# Patient Record
Sex: Female | Born: 1950 | Race: White | Hispanic: No | Marital: Married | Smoking: Never smoker
Health system: Southern US, Community
[De-identification: ages and names within clinical notes are randomized; demographics above are authoritative.]

## PROBLEM LIST (undated history)

## (undated) DIAGNOSIS — I4589 Other specified conduction disorders: Secondary | ICD-10-CM

## (undated) DIAGNOSIS — T148XXA Other injury of unspecified body region, initial encounter: Secondary | ICD-10-CM

## (undated) DIAGNOSIS — Z860101 Personal history of adenomatous and serrated colon polyps: Secondary | ICD-10-CM

## (undated) DIAGNOSIS — F419 Anxiety disorder, unspecified: Secondary | ICD-10-CM

## (undated) DIAGNOSIS — K219 Gastro-esophageal reflux disease without esophagitis: Secondary | ICD-10-CM

## (undated) DIAGNOSIS — D126 Benign neoplasm of colon, unspecified: Secondary | ICD-10-CM

## (undated) DIAGNOSIS — F32A Depression, unspecified: Secondary | ICD-10-CM

## (undated) DIAGNOSIS — K2100 Gastro-esophageal reflux disease with esophagitis, without bleeding: Secondary | ICD-10-CM

## (undated) DIAGNOSIS — E785 Hyperlipidemia, unspecified: Secondary | ICD-10-CM

## (undated) DIAGNOSIS — F329 Major depressive disorder, single episode, unspecified: Secondary | ICD-10-CM

## (undated) DIAGNOSIS — M1990 Unspecified osteoarthritis, unspecified site: Secondary | ICD-10-CM

## (undated) DIAGNOSIS — E782 Mixed hyperlipidemia: Secondary | ICD-10-CM

## (undated) DIAGNOSIS — G4730 Sleep apnea, unspecified: Secondary | ICD-10-CM

## (undated) DIAGNOSIS — I499 Cardiac arrhythmia, unspecified: Secondary | ICD-10-CM

## (undated) HISTORY — PX: TONSILLECTOMY: SHX5217

## (undated) HISTORY — DX: Other injury of unspecified body region, initial encounter: T14.8XXA

## (undated) HISTORY — PX: SHOULDER ARTHROSCOPY W/ ROTATOR CUFF REPAIR: SHX2400

## (undated) HISTORY — PX: ORIF RADIAL FRACTURE: SHX5113

## (undated) HISTORY — PX: ADENOIDECTOMY: SUR15

## (undated) HISTORY — PX: TONSILLECTOMY: SUR1361

## (undated) HISTORY — PX: BASAL CELL CARCINOMA EXCISION: SHX1214

## (undated) HISTORY — PX: COLONOSCOPY: SHX174

---

## 1987-11-02 HISTORY — PX: ABDOMINAL HYSTERECTOMY: SHX81

## 1988-11-01 HISTORY — PX: NASAL SEPTUM SURGERY: SHX37

## 2010-08-24 ENCOUNTER — Ambulatory Visit: Payer: Self-pay | Admitting: Internal Medicine

## 2011-11-04 ENCOUNTER — Ambulatory Visit: Payer: Self-pay | Admitting: Internal Medicine

## 2012-11-08 ENCOUNTER — Ambulatory Visit: Payer: Self-pay | Admitting: Internal Medicine

## 2013-01-11 ENCOUNTER — Ambulatory Visit: Payer: Self-pay | Admitting: Internal Medicine

## 2013-02-27 ENCOUNTER — Ambulatory Visit: Payer: Self-pay | Admitting: Cardiology

## 2013-03-09 ENCOUNTER — Ambulatory Visit: Payer: Self-pay | Admitting: Internal Medicine

## 2013-03-09 ENCOUNTER — Emergency Department: Payer: Self-pay | Admitting: Emergency Medicine

## 2013-11-01 HISTORY — PX: JOINT REPLACEMENT: SHX530

## 2014-11-12 ENCOUNTER — Ambulatory Visit: Payer: Self-pay | Admitting: Internal Medicine

## 2017-01-21 ENCOUNTER — Other Ambulatory Visit: Payer: Self-pay | Admitting: Internal Medicine

## 2017-01-21 DIAGNOSIS — Z1231 Encounter for screening mammogram for malignant neoplasm of breast: Secondary | ICD-10-CM

## 2017-02-16 ENCOUNTER — Ambulatory Visit: Payer: Self-pay

## 2017-02-23 ENCOUNTER — Ambulatory Visit
Admission: RE | Admit: 2017-02-23 | Discharge: 2017-02-23 | Disposition: A | Discharge status: Home / Self Care | Payer: Medicare HMO | Source: Ambulatory Visit | Attending: Internal Medicine | Admitting: Internal Medicine

## 2017-02-23 DIAGNOSIS — Z1231 Encounter for screening mammogram for malignant neoplasm of breast: Secondary | ICD-10-CM | POA: Diagnosis not present

## 2017-02-23 DIAGNOSIS — R928 Other abnormal and inconclusive findings on diagnostic imaging of breast: Secondary | ICD-10-CM | POA: Diagnosis not present

## 2017-02-28 ENCOUNTER — Other Ambulatory Visit: Payer: Self-pay | Admitting: Internal Medicine

## 2017-02-28 DIAGNOSIS — R928 Other abnormal and inconclusive findings on diagnostic imaging of breast: Secondary | ICD-10-CM

## 2017-03-03 ENCOUNTER — Ambulatory Visit
Admission: RE | Admit: 2017-03-03 | Discharge: 2017-03-03 | Disposition: A | Discharge status: Home / Self Care | Payer: Medicare HMO | Source: Ambulatory Visit | Attending: Internal Medicine | Admitting: Internal Medicine

## 2017-03-03 DIAGNOSIS — N6489 Other specified disorders of breast: Secondary | ICD-10-CM | POA: Insufficient documentation

## 2017-03-03 DIAGNOSIS — R928 Other abnormal and inconclusive findings on diagnostic imaging of breast: Secondary | ICD-10-CM

## 2017-03-04 ENCOUNTER — Other Ambulatory Visit: Payer: Self-pay | Admitting: Internal Medicine

## 2017-03-04 DIAGNOSIS — R928 Other abnormal and inconclusive findings on diagnostic imaging of breast: Secondary | ICD-10-CM

## 2017-03-10 ENCOUNTER — Ambulatory Visit
Admission: RE | Admit: 2017-03-10 | Discharge: 2017-03-10 | Disposition: A | Discharge status: Home / Self Care | Payer: Medicare HMO | Source: Ambulatory Visit | Attending: Internal Medicine | Admitting: Internal Medicine

## 2017-03-10 DIAGNOSIS — R928 Other abnormal and inconclusive findings on diagnostic imaging of breast: Secondary | ICD-10-CM

## 2017-03-10 DIAGNOSIS — N6313 Unspecified lump in the right breast, lower outer quadrant: Secondary | ICD-10-CM | POA: Diagnosis not present

## 2017-03-10 HISTORY — PX: BREAST BIOPSY: SHX20

## 2017-03-14 LAB — SURGICAL PATHOLOGY

## 2017-03-16 ENCOUNTER — Encounter: Payer: Self-pay | Admitting: *Deleted

## 2017-03-22 ENCOUNTER — Encounter: Payer: Self-pay | Admitting: *Deleted

## 2017-03-23 ENCOUNTER — Ambulatory Visit (INDEPENDENT_AMBULATORY_CARE_PROVIDER_SITE_OTHER): Payer: Medicare HMO | Admitting: General Surgery

## 2017-03-23 ENCOUNTER — Encounter: Payer: Self-pay | Admitting: General Surgery

## 2017-03-23 VITALS — BP 118/80 | HR 88 | Resp 14 | Ht 63.0 in | Wt 201.0 lb

## 2017-03-23 DIAGNOSIS — R928 Other abnormal and inconclusive findings on diagnostic imaging of breast: Secondary | ICD-10-CM

## 2017-03-23 NOTE — Progress Notes (Signed)
Patient ID: Jillian Armstrong, female   DOB: October 22, 1951, 66 y.o.   MRN: [id number]  Chief Complaint  Patient presents with  . Other    HPI Jillian Armstrong is a 66 y.o. female.  who presents for a breast evaluation. The most recent mammogram and biopsy was done on 03-10-17, showing benign tissue. She feels like she wants this area removed. Patient does not perform regular self breast checks and gets regular mammograms done every other year.    She is here with her husband, [name] of 18 years. 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  HPI  Past Medical History:  Diagnosis Date  . Fracture    right arm    Past Surgical History:  Procedure Laterality Date  . ABDOMINAL HYSTERECTOMY  1989  . BREAST BIOPSY Right 03/10/2017   PREDOMINANTLY MATURE ADIPOSE TISSUE WITH RARE BENIGN MAMMARY   . 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 NASAL SEPTUM SURGERY  1990  . TONSILLECTOMY      Family History  Problem Relation Age of Onset  . Hypertension Mother   . Diabetes Mother   . Arthritis Mother   . COPD Mother   . Arthritis Father   . Breast cancer Neg Hx   . Colon cancer Neg Hx     Social History Social History  Substance Use Topics  . Smoking status: Never Smoker  . Smokeless tobacco: Never Used  . Alcohol use No    Allergies  Allergen Reactions  . Contrast Media [Iodinated Diagnostic Agents] Shortness Of Breath  . Morphine Hives  . Codeine Rash    Hyper   . Penicillins Rash    Current Outpatient Prescriptions  Medication Sig Dispense Refill  . acetaminophen (TYLENOL) 325 MG tablet Take by mouth.    Marland Kitchen b complex vitamins tablet Take 1 tablet by mouth daily.    Marland Kitchen estradiol (ESTRACE) 1 MG tablet     . omeprazole (PRILOSEC) 20 MG capsule Take 20 mg by mouth as needed.     . senna-docusate (SENOKOT-S) 8.6-50 MG tablet Take by mouth.    . SUCRALFATE PO Take by mouth as needed.     No current facility-administered medications for this visit.     Review of  Systems Review of Systems  Constitutional: Negative.   Respiratory: Negative.   Cardiovascular: Negative.     Blood pressure 118/80, pulse 88, resp. rate 14, height 5\' 3"  (1.6 m), weight 201 lb (91.2 kg).  Physical Exam Physical Exam  Constitutional: She is oriented to person, place, and time. She appears well-developed and well-nourished.  HENT:  Mouth/Throat: Oropharynx is clear and moist.  Eyes: Conjunctivae are normal. No scleral icterus.  Neck: Neck supple.  Cardiovascular: Normal rate, regular rhythm and normal heart sounds.   Pulmonary/Chest: Effort normal and breath sounds normal. Right breast exhibits no inverted nipple, no mass, no nipple discharge, no skin change and no tenderness. Left breast exhibits no inverted nipple, no mass, no nipple discharge, no skin change and no tenderness.  Right > left breast.   Lymphadenopathy:    She has no cervical adenopathy.    She has no axillary adenopathy.  Neurological: She is alert and oriented to person, place, and time.  Skin: Skin is warm and dry.  Psychiatric: Her behavior is normal.    Data Reviewed 03/10/2017 vacuum biopsy of the right breast reviewed: DIAGNOSIS:  A. BREAST, RIGHT 8:30; ULTRASOUND GUIDED BIOPSY:  - PREDOMINANTLY MATURE ADIPOSE TISSUE  WITH RARE BENIGN MAMMARY  EPITHELIUM.  - NEGATIVE FOR ATYPIA AND MALIGNANCY.  - DEEPER SECTIONS WERE EXAMINED.   Screening mammogram dated 02/24/2017 suggested possible distortion in the right breast. BI-RADS-0.  Diagnostic mammogram and ultrasound dated 03/03/2017 suggested a 0.5 x 0.5 x 1.0 cm area of distinct shadowing in the 8:30 o'clock position of the right breast thought to possibly correlate with the area of distortion. Biopsy recommended. Results above.   Postbiopsy imaging reported the biopsy clip in the area of ultrasound abnormality. No mention of whether this area correlated with the distortion which prompted the initial recommendation for additional imaging.  Report from the radiologist that this was a discordant finding. Surgical excision was recommended.  11/12/2014 mammograms were reviewed and compared to the recently completed his studies. No discernible interval change on 2-D imaging.  Assessment    Mammographic distortion/ultrasound abnormality, unremarkable biopsy.    Plan    Options for management were reviewed: 1) Formal excision,? Mammographic distortion versus area recently biopsied with ultrasound versus 2) observation.  Pros and cons of each approach were reviewed.  Initial recommendation was for a 1 year follow-up if she did not proceed to formal excision. Based on review of the radiology reports post visit, it would be most reasonable to do a 6 month follow-up if surgical excision is not pursued.       Discussed excision of right breast mass vs observation. She can call the office in April 2019 for mammogram review. The patient is aware to call back for any questions or concerns. Follow up as needed.   HPI, Physical Exam, Assessment and Plan have been scribed under the direction and in the presence of Robert Bellow, MD.  Karie Fetch, RN  I have completed the exam and reviewed the above documentation for accuracy and completeness.  I agree with the above.  Haematologist has been used and any errors in dictation or transcription are unintentional.  Hervey Ard, M.D., F.A.C.S. Robert Bellow 03/23/2017, 8:47 PM

## 2017-03-23 NOTE — Patient Instructions (Addendum)
The patient is aware to call back for any questions or concerns. She can call the office in April 2019 for mammogram review.

## 2017-03-24 ENCOUNTER — Telehealth: Payer: Self-pay | Admitting: *Deleted

## 2017-03-24 NOTE — Telephone Encounter (Signed)
-----   Message from Robert Bellow, MD sent at 03/23/2017  8:53 PM EDT ----- Please notify the patient that I have reviewed the mammograms and reports dating back to 2016. Based on the radiologist concern postbiopsy I would recommend at a minimum a six-month follow-up right diagnostic mammogram to be scheduled through this office rather that waiting 12 months.

## 2017-03-24 NOTE — Telephone Encounter (Signed)
Notified patient as instructed, patient pleased. Discussed follow-up appointments, patient agrees  

## 2017-03-25 ENCOUNTER — Other Ambulatory Visit: Payer: Self-pay | Admitting: General Surgery

## 2017-03-25 ENCOUNTER — Other Ambulatory Visit: Payer: Self-pay

## 2017-03-25 ENCOUNTER — Telehealth: Payer: Self-pay | Admitting: General Surgery

## 2017-03-25 DIAGNOSIS — R928 Other abnormal and inconclusive findings on diagnostic imaging of breast: Secondary | ICD-10-CM

## 2017-03-25 NOTE — Telephone Encounter (Signed)
After the patient's visit I had a chance to review all of the mammogram and ultrasound reports, and had an unclear understanding as to whether the mammographic and ultrasound areas of concern were one in the same.  The films were re-reviewed with Enrique Sack, M.D., head of mammography and its thought likely that they are indeed one in the same. The diagnostic study showing distortion followed by ultrasound showing a hypoechoic area followed by post biopsy, 2-D images suggest but doesn't absolutely confirm they're all one in the same, although they are in close proximity at the worst.  Options for management include a 3-D stereo biopsy of the original distortion and post biopsy imaging to confirm relationship to the ultrasound finding versus wire localization with excision of a broader swath of breast parenchyma to include the ultrasound biopsy site with anticipation that this will include the area of architectural distortion noted on 3-D imaging.  This new analysis of the available data was conveyed to the patient and she is amenable to proceed with wire localization and surgical excision.

## 2017-03-29 ENCOUNTER — Telehealth: Payer: Self-pay

## 2017-03-29 NOTE — Telephone Encounter (Signed)
Call to patient to review surgery instructions and arrival time. The patient is scheduled for surgery at [facility] on 04/06/17. She will arrive at the [facility] on 04/06/17 at 8:15 am for her wire location. She will pre admit by phone. The patient is aware of date and time.

## 2017-03-29 NOTE — Telephone Encounter (Signed)
thanks

## 2017-03-31 ENCOUNTER — Encounter
Admission: RE | Admit: 2017-03-31 | Discharge: 2017-03-31 | Disposition: A | Discharge status: Home / Self Care | Payer: Medicare HMO | Source: Ambulatory Visit | Attending: General Surgery | Admitting: General Surgery

## 2017-03-31 HISTORY — DX: Unspecified osteoarthritis, unspecified site: M19.90

## 2017-03-31 HISTORY — DX: Gastro-esophageal reflux disease without esophagitis: K21.9

## 2017-03-31 NOTE — Patient Instructions (Signed)
  Your procedure is scheduled on: 04-06-17 [facility] Report to [place] @ 8:15 AM  Remember: Instructions that are not followed completely may result in serious medical risk, up to and including death, or upon the discretion of your surgeon and anesthesiologist your surgery may need to be rescheduled.    _x___ 1. Do not eat food or drink liquids after midnight. No gum chewing or hard candies.     __x__ 2. No Alcohol for 24 hours before or after surgery.   __x__3. No Smoking for 24 prior to surgery.   ____  4. Bring all medications with you on the day of surgery if instructed.    __x__ 5. Notify your doctor if there is any change in your medical condition     (cold, fever, infections).     Do not wear jewelry, make-up, hairpins, clips or nail polish.  Do not wear lotions, powders, or perfumes. You may wear deodorant.  Do not shave 48 hours prior to surgery. Men may shave face and neck.  Do not bring valuables to the hospital.    [facility] is not responsible for any belongings or valuables.               Contacts, dentures or bridgework may not be worn into surgery.  Leave your suitcase in the car. After surgery it may be brought to your room.  For patients admitted to the hospital, discharge time is determined by your treatment team.   Patients discharged the day of surgery will not be allowed to drive home.  You will need someone to drive you home and stay with you the night of your procedure.    Please read over the following fact sheets that you were given:   _x___ Singer WITH A SMALL SIP OF WATER. These include:  1. PRILOSEC (OMEPRAZOLE)  2. TAKE A PRILOSEC THE NIGHT BEFORE SURGERY (04-05-17)  3.  4.  5.  6.  ____Fleets enema or Magnesium Citrate as directed.   _x___ Use CHG Soap or sage wipes as directed on instruction sheet   ____ Use inhalers on the day of surgery and bring to hospital day of surgery  ____  Stop Metformin and Janumet 2 days prior to surgery.    ____ Take 1/2 of usual insulin dose the night before surgery and none on the morning surgery.   _x___ Follow recommendations from Cardiologist, Pulmonologist or PCP regarding stopping Aspirin, Coumadin, Pllavix ,Eliquis, Effient, or Pradaxa, and Pletal-OK TO CONTINUE 81 MG ASPIRIN-DO NOT TAKE AM OF SURGERY  ____Stop Anti-inflammatories such as Advil, Aleve, Ibuprofen, Motrin, Naproxen, Naprosyn, Goodies powders or aspirin products. OK to take Tylenol   _x___ Stop supplements until after surgery-STOP FISH OIL NOW-MAY RESUME AFTER SURGERY   ____ Bring C-Pap to the hospital.

## 2017-04-04 ENCOUNTER — Encounter
Admission: RE | Admit: 2017-04-04 | Discharge: 2017-04-04 | Disposition: A | Discharge status: Home / Self Care | Payer: Medicare HMO | Source: Ambulatory Visit | Attending: General Surgery | Admitting: General Surgery

## 2017-04-04 DIAGNOSIS — Z880 Allergy status to penicillin: Secondary | ICD-10-CM | POA: Diagnosis not present

## 2017-04-04 DIAGNOSIS — Z79899 Other long term (current) drug therapy: Secondary | ICD-10-CM | POA: Diagnosis not present

## 2017-04-04 DIAGNOSIS — K219 Gastro-esophageal reflux disease without esophagitis: Secondary | ICD-10-CM | POA: Diagnosis not present

## 2017-04-04 DIAGNOSIS — N6031 Fibrosclerosis of right breast: Secondary | ICD-10-CM | POA: Diagnosis not present

## 2017-04-04 DIAGNOSIS — Z7982 Long term (current) use of aspirin: Secondary | ICD-10-CM | POA: Diagnosis not present

## 2017-04-04 DIAGNOSIS — Z91041 Radiographic dye allergy status: Secondary | ICD-10-CM | POA: Diagnosis not present

## 2017-04-04 DIAGNOSIS — N6091 Unspecified benign mammary dysplasia of right breast: Secondary | ICD-10-CM | POA: Diagnosis not present

## 2017-04-04 DIAGNOSIS — R928 Other abnormal and inconclusive findings on diagnostic imaging of breast: Secondary | ICD-10-CM | POA: Diagnosis present

## 2017-04-04 DIAGNOSIS — Z885 Allergy status to narcotic agent status: Secondary | ICD-10-CM | POA: Diagnosis not present

## 2017-04-04 NOTE — Pre-Procedure Instructions (Addendum)
Spoke with Dr Randa Lynn regarding abnormal EKG- Dr Randa Lynn looked up EKG in Epic-Informed her that pt has no cardiac history and asymptomatic-EKG was done only due to Anesthesia's age requirement of > 67 years old.  Dr Randa Lynn states ok to proceed with surgery

## 2017-04-06 ENCOUNTER — Ambulatory Visit
Admission: RE | Admit: 2017-04-06 | Discharge: 2017-04-06 | Disposition: A | Discharge status: Home / Self Care | Payer: Medicare HMO | Source: Ambulatory Visit | Attending: General Surgery | Admitting: General Surgery

## 2017-04-06 ENCOUNTER — Encounter
Admission: RE | Disposition: A | Discharge status: Home / Self Care | Payer: Self-pay | Source: Ambulatory Visit | Attending: General Surgery

## 2017-04-06 ENCOUNTER — Ambulatory Visit: Payer: Medicare HMO | Admitting: Anesthesiology

## 2017-04-06 ENCOUNTER — Encounter: Payer: Self-pay | Admitting: *Deleted

## 2017-04-06 DIAGNOSIS — N6031 Fibrosclerosis of right breast: Secondary | ICD-10-CM | POA: Diagnosis not present

## 2017-04-06 DIAGNOSIS — R928 Other abnormal and inconclusive findings on diagnostic imaging of breast: Secondary | ICD-10-CM

## 2017-04-06 DIAGNOSIS — Z7982 Long term (current) use of aspirin: Secondary | ICD-10-CM | POA: Insufficient documentation

## 2017-04-06 DIAGNOSIS — Z885 Allergy status to narcotic agent status: Secondary | ICD-10-CM | POA: Insufficient documentation

## 2017-04-06 DIAGNOSIS — Z91041 Radiographic dye allergy status: Secondary | ICD-10-CM | POA: Insufficient documentation

## 2017-04-06 DIAGNOSIS — N6091 Unspecified benign mammary dysplasia of right breast: Secondary | ICD-10-CM | POA: Insufficient documentation

## 2017-04-06 DIAGNOSIS — K219 Gastro-esophageal reflux disease without esophagitis: Secondary | ICD-10-CM | POA: Insufficient documentation

## 2017-04-06 DIAGNOSIS — Z79899 Other long term (current) drug therapy: Secondary | ICD-10-CM | POA: Insufficient documentation

## 2017-04-06 DIAGNOSIS — Z880 Allergy status to penicillin: Secondary | ICD-10-CM | POA: Insufficient documentation

## 2017-04-06 HISTORY — PX: BREAST EXCISIONAL BIOPSY: SUR124

## 2017-04-06 HISTORY — PX: BREAST BIOPSY: SHX20

## 2017-04-06 SURGERY — BREAST BIOPSY WITH NEEDLE LOCALIZATION
Anesthesia: General | Laterality: Right | Wound class: Clean

## 2017-04-06 MED ORDER — DEXAMETHASONE SODIUM PHOSPHATE 10 MG/ML IJ SOLN
INTRAMUSCULAR | Status: DC | PRN
  Administered 2017-04-06: 4 mg via INTRAVENOUS

## 2017-04-06 MED ORDER — PROPOFOL 10 MG/ML IV BOLUS
INTRAVENOUS | Status: DC | PRN
Start: 2017-04-06 — End: 2017-04-06
  Administered 2017-04-06: 150 mg via INTRAVENOUS

## 2017-04-06 MED ORDER — LIDOCAINE HCL (CARDIAC) 20 MG/ML IV SOLN
INTRAVENOUS | Status: DC | PRN
  Administered 2017-04-06: 50 mg via INTRAVENOUS

## 2017-04-06 MED ORDER — EPHEDRINE SULFATE 50 MG/ML IJ SOLN
INTRAMUSCULAR | Status: AC
  Filled 2017-04-06: qty 1

## 2017-04-06 MED ORDER — ONDANSETRON HCL 4 MG/2ML IJ SOLN
INTRAMUSCULAR | Status: AC
  Filled 2017-04-06: qty 2

## 2017-04-06 MED ORDER — LIDOCAINE HCL (PF) 2 % IJ SOLN
INTRAMUSCULAR | Status: AC
  Filled 2017-04-06: qty 2

## 2017-04-06 MED ORDER — ACETAMINOPHEN 325 MG PO TABS
650.0000 mg | ORAL_TABLET | Freq: Four times a day (QID) | ORAL | Status: DC | PRN
  Administered 2017-04-06: 650 mg via ORAL

## 2017-04-06 MED ORDER — FENTANYL CITRATE (PF) 100 MCG/2ML IJ SOLN: 25.0000 ug | INTRAMUSCULAR | Status: DC | PRN

## 2017-04-06 MED ORDER — KETOROLAC TROMETHAMINE 30 MG/ML IJ SOLN
INTRAMUSCULAR | Status: DC | PRN
  Administered 2017-04-06: 15 mg via INTRAVENOUS

## 2017-04-06 MED ORDER — FENTANYL CITRATE (PF) 100 MCG/2ML IJ SOLN
INTRAMUSCULAR | Status: DC | PRN
  Administered 2017-04-06: 25 ug via INTRAVENOUS
  Administered 2017-04-06: 50 ug via INTRAVENOUS

## 2017-04-06 MED ORDER — PROPOFOL 10 MG/ML IV BOLUS
INTRAVENOUS | Status: AC
  Filled 2017-04-06: qty 20

## 2017-04-06 MED ORDER — LACTATED RINGERS IV SOLN
INTRAVENOUS | Status: DC
  Administered 2017-04-06: 11:00:00 via INTRAVENOUS

## 2017-04-06 MED ORDER — MIDAZOLAM HCL 2 MG/2ML IJ SOLN
INTRAMUSCULAR | Status: AC
  Filled 2017-04-06: qty 2

## 2017-04-06 MED ORDER — PHENYLEPHRINE HCL 10 MG/ML IJ SOLN
INTRAMUSCULAR | Status: DC | PRN
  Administered 2017-04-06 (×2): 50 ug via INTRAVENOUS

## 2017-04-06 MED ORDER — ONDANSETRON HCL 4 MG/2ML IJ SOLN
INTRAMUSCULAR | Status: DC | PRN
Start: 2017-04-06 — End: 2017-04-06
  Administered 2017-04-06: 4 mg via INTRAVENOUS

## 2017-04-06 MED ORDER — ONDANSETRON HCL 4 MG/2ML IJ SOLN: 4.0000 mg | Freq: Once | INTRAMUSCULAR | Status: DC | PRN

## 2017-04-06 MED ORDER — MIDAZOLAM HCL 2 MG/2ML IJ SOLN
INTRAMUSCULAR | Status: DC | PRN
  Administered 2017-04-06: 2 mg via INTRAVENOUS

## 2017-04-06 MED ORDER — BUPIVACAINE-EPINEPHRINE (PF) 0.5% -1:200000 IJ SOLN
INTRAMUSCULAR | Status: AC
  Filled 2017-04-06: qty 30

## 2017-04-06 MED ORDER — DEXAMETHASONE SODIUM PHOSPHATE 10 MG/ML IJ SOLN
INTRAMUSCULAR | Status: AC
  Filled 2017-04-06: qty 1

## 2017-04-06 MED ORDER — HYDROCODONE-ACETAMINOPHEN 5-325 MG PO TABS: 1.0000 | ORAL_TABLET | ORAL | 0 refills | Status: AC | PRN

## 2017-04-06 MED ORDER — SODIUM CHLORIDE 0.9 % IJ SOLN
INTRAMUSCULAR | Status: AC
  Filled 2017-04-06: qty 10

## 2017-04-06 MED ORDER — EPHEDRINE SULFATE 50 MG/ML IJ SOLN
INTRAMUSCULAR | Status: DC | PRN
Start: 2017-04-06 — End: 2017-04-06
  Administered 2017-04-06: 5 mg via INTRAVENOUS

## 2017-04-06 MED ORDER — FENTANYL CITRATE (PF) 100 MCG/2ML IJ SOLN
INTRAMUSCULAR | Status: AC
  Filled 2017-04-06: qty 2

## 2017-04-06 MED ORDER — ACETAMINOPHEN 325 MG PO TABS
ORAL_TABLET | ORAL | Status: AC
  Filled 2017-04-06: qty 2

## 2017-04-06 SURGICAL SUPPLY — 39 items
BANDAGE ELASTIC 6 LF NS (GAUZE/BANDAGES/DRESSINGS) ×3 IMPLANT
BLADE SURG 15 STRL SS SAFETY (BLADE) ×6 IMPLANT
BNDG GAUZE 4.5X4.1 6PLY STRL (MISCELLANEOUS) ×6 IMPLANT
CANISTER SUCT 1200ML W/VALVE (MISCELLANEOUS) ×3 IMPLANT
CHLORAPREP W/TINT 26ML (MISCELLANEOUS) ×6 IMPLANT
CLOSURE WOUND 1/2 X4 (GAUZE/BANDAGES/DRESSINGS) ×2
CNTNR SPEC 2.5X3XGRAD LEK (MISCELLANEOUS) ×1
CONT SPEC 4OZ STER OR WHT (MISCELLANEOUS) ×2
CONTAINER SPEC 2.5X3XGRAD LEK (MISCELLANEOUS) ×1 IMPLANT
COVER PROBE FLX POLY STRL (MISCELLANEOUS) ×3 IMPLANT
DEVICE DUBIN SPECIMEN MAMMOGRA (MISCELLANEOUS) ×3 IMPLANT
DRAPE CHEST BREAST 77X106 FENE (MISCELLANEOUS) ×3 IMPLANT
DRAPE LAPAROTOMY 100X77 ABD (DRAPES) ×3 IMPLANT
DRSG TELFA 4X3 1S NADH ST (GAUZE/BANDAGES/DRESSINGS) ×6 IMPLANT
ELECT CAUTERY BLADE TIP 2.5 (TIP) ×3
ELECT REM PT RETURN 9FT ADLT (ELECTROSURGICAL) ×3
ELECTRODE CAUTERY BLDE TIP 2.5 (TIP) ×1 IMPLANT
ELECTRODE REM PT RTRN 9FT ADLT (ELECTROSURGICAL) ×1 IMPLANT
GAUZE FLUFF 18X24 1PLY STRL (GAUZE/BANDAGES/DRESSINGS) ×3 IMPLANT
GLOVE BIO SURGEON STRL SZ7.5 (GLOVE) ×3 IMPLANT
GLOVE INDICATOR 8.0 STRL GRN (GLOVE) ×3 IMPLANT
GOWN STRL REUS W/ TWL LRG LVL3 (GOWN DISPOSABLE) ×2 IMPLANT
GOWN STRL REUS W/TWL LRG LVL3 (GOWN DISPOSABLE) ×4
KIT RM TURNOVER STRD PROC AR (KITS) ×3 IMPLANT
LABEL OR SOLS (LABEL) ×3 IMPLANT
MARGIN MAP 10MM (MISCELLANEOUS) ×3 IMPLANT
NDL SAFETY 22GX1.5 (NEEDLE) ×3 IMPLANT
NEEDLE HYPO 25X1 1.5 SAFETY (NEEDLE) ×3 IMPLANT
PACK BASIN MINOR ARMC (MISCELLANEOUS) ×3 IMPLANT
STRIP CLOSURE SKIN 1/2X4 (GAUZE/BANDAGES/DRESSINGS) ×4 IMPLANT
SUT ETHILON 3-0 FS-10 30 BLK (SUTURE) ×3
SUT VIC AB 2-0 CT1 27 (SUTURE) ×2
SUT VIC AB 2-0 CT1 TAPERPNT 27 (SUTURE) ×1 IMPLANT
SUT VIC AB 4-0 FS2 27 (SUTURE) ×3 IMPLANT
SUTURE EHLN 3-0 FS-10 30 BLK (SUTURE) ×1 IMPLANT
SWABSTK COMLB BENZOIN TINCTURE (MISCELLANEOUS) ×6 IMPLANT
SYR CONTROL 10ML (SYRINGE) ×3 IMPLANT
TAPE TRANSPORE STRL 2 31045 (GAUZE/BANDAGES/DRESSINGS) ×3 IMPLANT
WATER STERILE IRR 1000ML POUR (IV SOLUTION) ×3 IMPLANT

## 2017-04-06 NOTE — OR Nursing (Signed)
Dr. Bary Castilla in to see pt [id number].

## 2017-04-06 NOTE — Transfer of Care (Signed)
Immediate Anesthesia Transfer of Care Note  Patient: Jillian Armstrong  Procedure(s) Performed: Procedure(s): BREAST BIOPSY WITH NEEDLE LOCALIZATION (Right)  Patient Location: PACU  Anesthesia Type:General  Level of Consciousness: sedated and responds to stimulation  Airway & Oxygen Therapy: Patient Spontanous Breathing and Patient connected to face mask oxygen  Post-op Assessment: Report given to RN and Post -op Vital signs reviewed and stable  Post vital signs: Reviewed and stable  Last Vitals:  Vitals:   04/06/17 0931 04/06/17 1316  BP: 122/73 120/63  Pulse: 73 93  Resp: 18 14  Temp: 36.7 C     Last Pain:  Vitals:   04/06/17 0931  TempSrc: Oral  PainSc: 3          Complications: No apparent anesthesia complications

## 2017-04-06 NOTE — Anesthesia Preprocedure Evaluation (Signed)
Anesthesia Evaluation  Patient identified by MRN, date of birth, ID band Patient awake    Reviewed: Allergy & Precautions, NPO status , Patient's Chart, lab work & pertinent test results  Airway Mallampati: II       Dental  (+) Teeth Intact   Pulmonary neg pulmonary ROS,    breath sounds clear to auscultation       Cardiovascular Exercise Tolerance: Good  Rhythm:Regular     Neuro/Psych negative neurological ROS  negative psych ROS   GI/Hepatic Neg liver ROS, GERD  Medicated,  Endo/Other  negative endocrine ROS  Renal/GU negative Renal ROS     Musculoskeletal   Abdominal   Peds negative pediatric ROS (+)  Hematology   Anesthesia Other Findings   Reproductive/Obstetrics                             Anesthesia Physical Anesthesia Plan  ASA: II  Anesthesia Plan: General   Post-op Pain Management:    Induction: Intravenous  PONV Risk Score and Plan:   Airway Management Planned: LMA  Additional Equipment:   Intra-op Plan:   Post-operative Plan: Extubation in OR  Informed Consent: I have reviewed the patients History and Physical, chart, labs and discussed the procedure including the risks, benefits and alternatives for the proposed anesthesia with the patient or authorized representative who has indicated his/her understanding and acceptance.     Plan Discussed with: CRNA  Anesthesia Plan Comments:         Anesthesia Quick Evaluation

## 2017-04-06 NOTE — Anesthesia Procedure Notes (Signed)
Procedure Name: LMA Insertion Performed by: Louvinia Cumbo Pre-anesthesia Checklist: Patient identified, Patient being monitored, Timeout performed, Emergency Drugs available and Suction available Patient Re-evaluated:Patient Re-evaluated prior to inductionOxygen Delivery Method: Circle system utilized Preoxygenation: Pre-oxygenation with 100% oxygen Intubation Type: IV induction LMA: LMA inserted LMA Size: 3.5 Tube type: Oral Number of attempts: 1 Placement Confirmation: positive ETCO2 and breath sounds checked- equal and bilateral Tube secured with: Tape Dental Injury: Teeth and Oropharynx as per pre-operative assessment        

## 2017-04-06 NOTE — Anesthesia Postprocedure Evaluation (Signed)
Anesthesia Post Note  Patient: Jillian Armstrong  Procedure(s) Performed: Procedure(s) (LRB): BREAST BIOPSY WITH NEEDLE LOCALIZATION (Right)  Patient location during evaluation: PACU Anesthesia Type: General Level of consciousness: awake Pain management: pain level controlled Vital Signs Assessment: post-procedure vital signs reviewed and stable Respiratory status: spontaneous breathing Cardiovascular status: stable Anesthetic complications: no     Last Vitals:  Vitals:   04/06/17 0931 04/06/17 1316  BP: 122/73 120/63  Pulse: 73 93  Resp: 18 14  Temp: 36.7 C     Last Pain:  Vitals:   04/06/17 0931  TempSrc: Oral  PainSc: 3                  VAN STAVEREN,Mahiya Kercheval

## 2017-04-06 NOTE — Discharge Instructions (Signed)

## 2017-04-06 NOTE — H&P (Signed)
Tolerated needle localization well.  Films reviewed. Plans for excision of original biopsy site discussed.  No change in health since office exam.

## 2017-04-06 NOTE — Addendum Note (Signed)
Addendum  created 04/06/17 1502 by Lance Muss, CRNA   Anesthesia Event edited, Anesthesia Intra Meds edited

## 2017-04-06 NOTE — Op Note (Signed)
Preoperative diagnosis: Abnormal right breast mammogram, inconclusive stereotactic biopsy.  Postoperative diagnosis: Same.  Operative procedure: Right breast biopsy with needle localization.  Operating surgeon: Lanney Gins, M.D.  Anesthesia: Gen. by LMA, Marcaine 0.5% with 1-200,000 of epinephrine: 20 mL.  Estimated blood loss: Less than 5 mL.  Clinical note: This 66 year old woman recently underwent a biopsy for distortion in the right breast. Biopsy was felt to scored by the radiology service. She was offered open biopsy for confirmation of an anticipated benign pathologic process.  The patient underwent wire localization prior the procedure with the wire passing immediately adjacent to the previously placed clip.  Operative note: With the patient under adequate general the breast was prepped with ChloraPrep and draped. Marcaine was infiltrated for postoperative analgesia. The radial incision was made from the localizing wire proximally towards nipple approximate 3 cm in length. The skin was incised sharply and the specimen dissection was completed with sharp dissection. The wire with the intact  tip was excised, the specimen orientated and specimen radiograph confirmed the previously placed clip and the intact wire. The wound was approximated with interrupted 2-0 Vicryl sutures in multiple layers. The skin was closed with a running 4-0 Vicryl septic suture. Benzoin, Steri-Strips, followed by a honeycomb dressing was applied.  The patient tolerated the procedure well and was taken to recovery in stable condition.

## 2017-04-06 NOTE — Anesthesia Post-op Follow-up Note (Cosign Needed)
Anesthesia QCDR form completed.        

## 2017-04-07 ENCOUNTER — Encounter: Payer: Self-pay | Admitting: General Surgery

## 2017-04-07 LAB — SURGICAL PATHOLOGY

## 2017-04-13 ENCOUNTER — Encounter: Payer: Self-pay | Admitting: General Surgery

## 2017-04-13 ENCOUNTER — Ambulatory Visit (INDEPENDENT_AMBULATORY_CARE_PROVIDER_SITE_OTHER): Payer: Medicare HMO | Admitting: General Surgery

## 2017-04-13 VITALS — BP 110/76 | HR 116 | Resp 12 | Ht 64.0 in | Wt 200.0 lb

## 2017-04-13 DIAGNOSIS — R928 Other abnormal and inconclusive findings on diagnostic imaging of breast: Secondary | ICD-10-CM

## 2017-04-13 NOTE — Progress Notes (Signed)
Patient ID: Jillian Armstrong, female   DOB: 1951/02/07, 66 y.o.   MRN: [id number]  Chief Complaint  Patient presents with  . Follow-up    HPI Jillian Armstrong is a 66 y.o. female here today for her follow up right breast biopsy done on 04/06/2017. Patient states she is doing well. Husband, Elta Guadeloupe is present at visit.   HPI  Past Medical History:  Diagnosis Date  . Arthritis    hands bil  . Fracture    right arm  . GERD (gastroesophageal reflux disease)     Past Surgical History:  Procedure Laterality Date  . ABDOMINAL HYSTERECTOMY  1989  . BREAST BIOPSY Right 03/10/2017   PREDOMINANTLY MATURE ADIPOSE TISSUE WITH RARE BENIGN MAMMARY   . BREAST BIOPSY Right 04/06/2017   Procedure: BREAST BIOPSY WITH NEEDLE LOCALIZATION;  Surgeon: Robert Bellow, MD;  Location: [facility] ORS;  Service: General;  Laterality: Right;  . BREAST EXCISIONAL BIOPSY Right 04/06/2017   Pt elected to have area excised. PREDOMINANTLY MATURE ADIPOSE TISSUE WITH RARE BENIGN MAMMARY   . 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 NASAL SEPTUM SURGERY  1990  . TONSILLECTOMY      Family History  Problem Relation Age of Onset  . Hypertension Mother   . Diabetes Mother   . Arthritis Mother   . COPD Mother   . Arthritis Father   . Breast cancer Neg Hx   . Colon cancer Neg Hx     Social History Social History  Substance Use Topics  . Smoking status: Never Smoker  . Smokeless tobacco: Never Used  . Alcohol use Yes     Comment: wine occ    Allergies  Allergen Reactions  . Contrast Media [Iodinated Diagnostic Agents] Shortness Of Breath  . Morphine Hives  . Codeine Rash    Hyper   . Penicillins Rash    Current Outpatient Prescriptions  Medication Sig Dispense Refill  . acetaminophen (TYLENOL) 325 MG tablet Take 325 mg by mouth at bedtime.     Marland Kitchen aspirin EC 81 MG tablet Take 81 mg by mouth daily.    Marland Kitchen b complex vitamins tablet Take 1 tablet by mouth as needed (PT DOES NOT TAKE CONSISTENTLY).     .  Calcium Carbonate (CALCIUM 600 PO) Take 1 tablet by mouth as needed (PT DOES NOT TAKE CONSISTENTLY).    . Cholecalciferol (VITAMIN D PO) Take 1 tablet by mouth as needed (DOES NOT TAKE CONSISTENTLY).    Marland Kitchen estradiol (ESTRACE) 1 MG tablet     . HYDROcodone-acetaminophen (NORCO) 5-325 MG tablet Take 1-2 tablets by mouth every 4 (four) hours as needed for moderate pain. 30 tablet 0  . Omega-3 Fatty Acids (FISH OIL PO) Take 1 tablet by mouth as needed (PT DOES NOT TAKE CONSISTENTLY).     Marland Kitchen omeprazole (PRILOSEC) 20 MG capsule Take 20 mg by mouth as needed.     . senna-docusate (SENOKOT-S) 8.6-50 MG tablet Take 2-3 tablets by mouth at bedtime.     . Simethicone (GAS-X PO) Take 1 tablet by mouth as needed.    . SUCRALFATE PO Take 1 tablet by mouth as needed.      No current facility-administered medications for this visit.     Review of Systems Review of Systems  Constitutional: Negative.   Respiratory: Negative.   Cardiovascular: Negative.     Blood pressure 110/76, pulse (!) 116, resp. rate 12, height 5\' 4"  (1.626 m), weight 200 lb (90.7 kg).  Physical Exam Physical Exam  Constitutional: She is oriented to person, place, and time. She appears well-developed and well-nourished.  Pulmonary/Chest: Right breast exhibits no inverted nipple, no mass, no nipple discharge, no skin change and no tenderness.  Right breast biopsy site is clean and healing well.   Neurological: She is alert and oriented to person, place, and time.  Skin: Skin is warm and dry.    Data Reviewed 04/06/2017 wire localization breast biopsy of the site previously biopsied stereotactically for distortion on 03/10/2017: A. BREAST, RIGHT; MAMMOGRAPHICALLY-DIRECTED EXCISION WITH WIRE  LOCALIZATION:  - AREAS OF NONSPECIFIC FIBROSIS, FOCAL USUAL DUCTAL HYPERPLASIA, AND A  FEW APOCRINE MICROCYSTS.  - BIOPSY SITE CHANGES AND MARKER CLIP PRESENT.  - NEGATIVE FOR ATYPIA AND MALIGNANCY.   Assessment    Doing well status post  open biopsy for mammographic distortion with discordant stereo biopsy.    Plan    The patient will resume annual screening mammograms with Emily Filbert, M.D. in May 2018.    Patient to return as needed. The patient is aware to call back for any questions or concerns.  HPI, Physical Exam, Assessment and Plan have been scribed under the direction and in the presence of Hervey Ard, MD.  Gaspar Cola, CMA  I have completed the exam and reviewed the above documentation for accuracy and completeness.  I agree with the above.  Haematologist has been used and any errors in dictation or transcription are unintentional.  Hervey Ard, M.D., F.A.C.S.  Robert Bellow 04/14/2017, 8:40 PM

## 2017-04-13 NOTE — Patient Instructions (Signed)
Patient to return as needed. The patient is aware to call back for any questions or concerns. 

## 2018-11-14 IMAGING — MG MM DIGITAL DIAGNOSTIC UNILAT*R* W/ TOMO W/ CAD
6 of 9 series · 6 of 21 positions shown · non-contrast
Comparison: Previous exam(s).

CLINICAL DATA: Screening recall for a possible right breast
distortion.

EXAM:
2D DIGITAL DIAGNOSTIC UNILATERAL RIGHT MAMMOGRAM WITH CAD AND
ADJUNCT TOMO
RIGHT BREAST ULTRASOUND

[R CC synth-2D (1 of 2)]
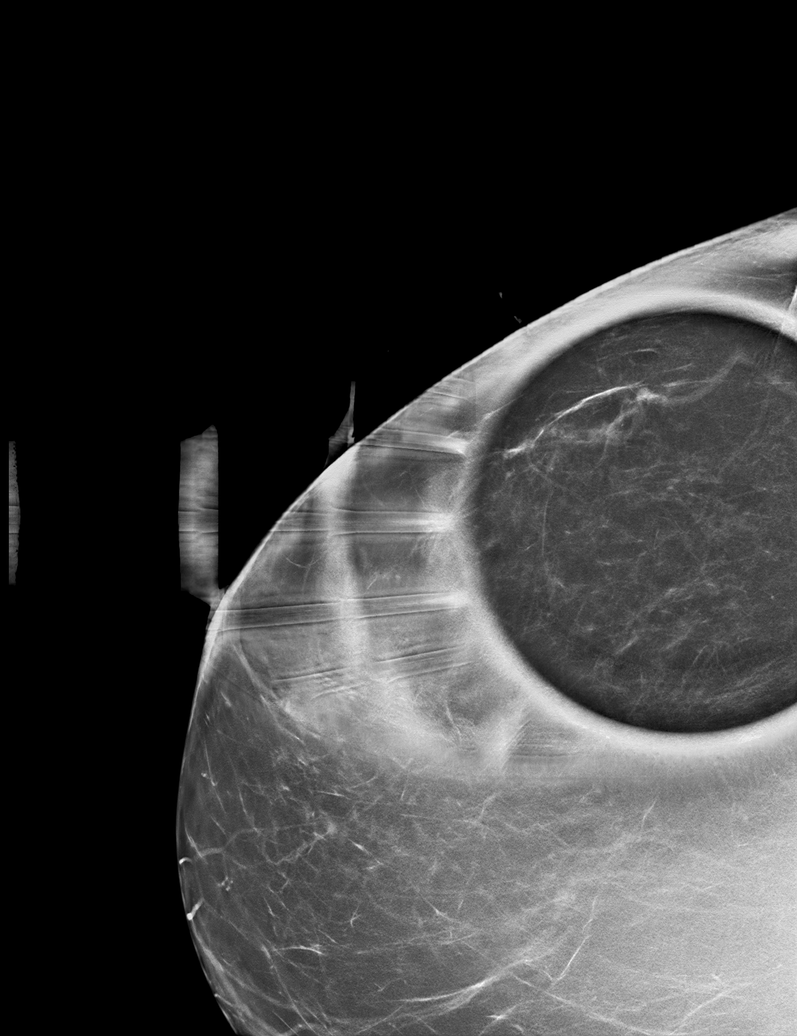

[R CC (1 of 2)]
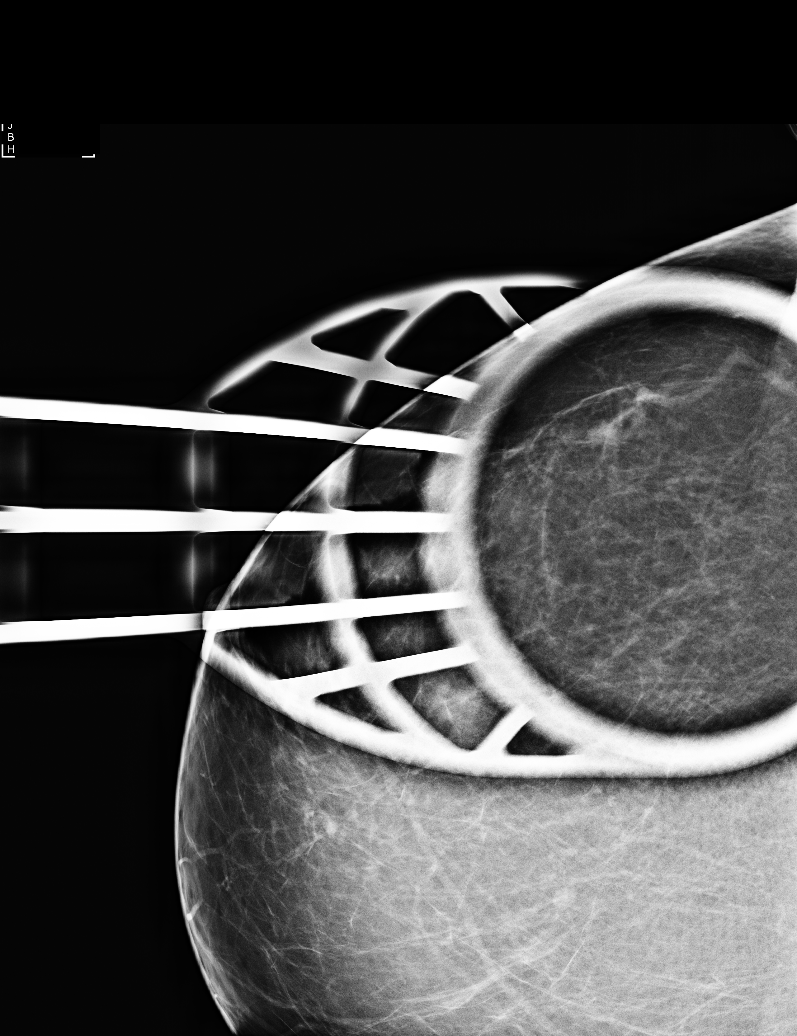

[R CC synth-2D (2 of 2)]
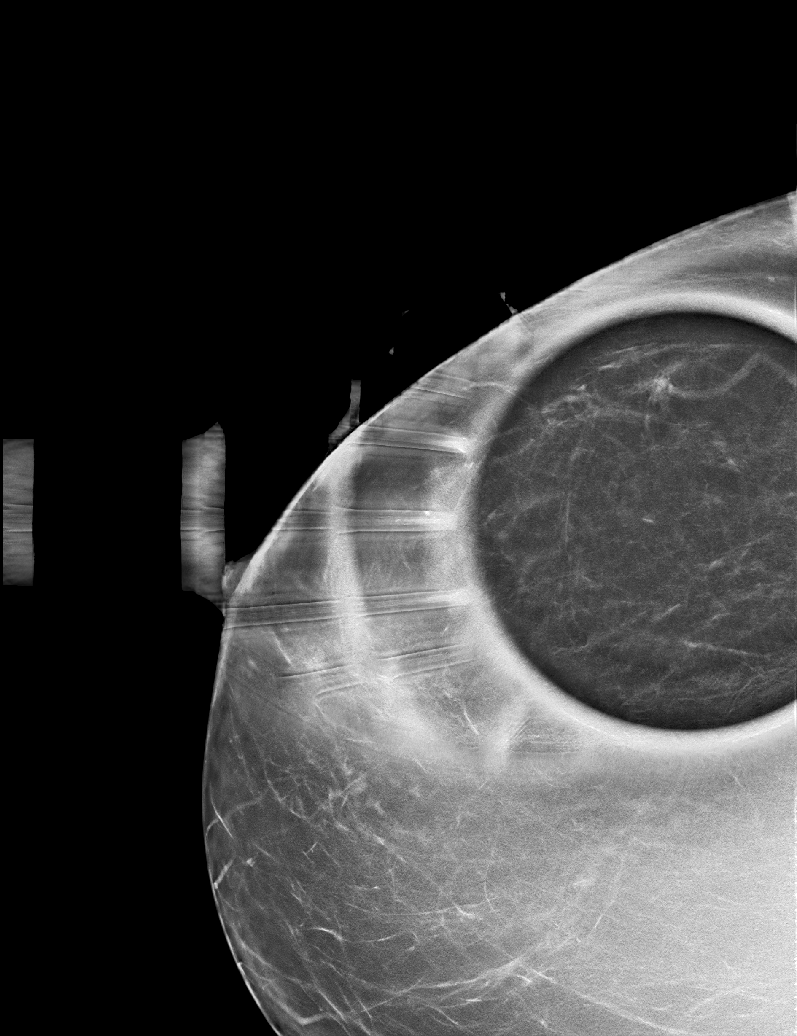

[R MLO]
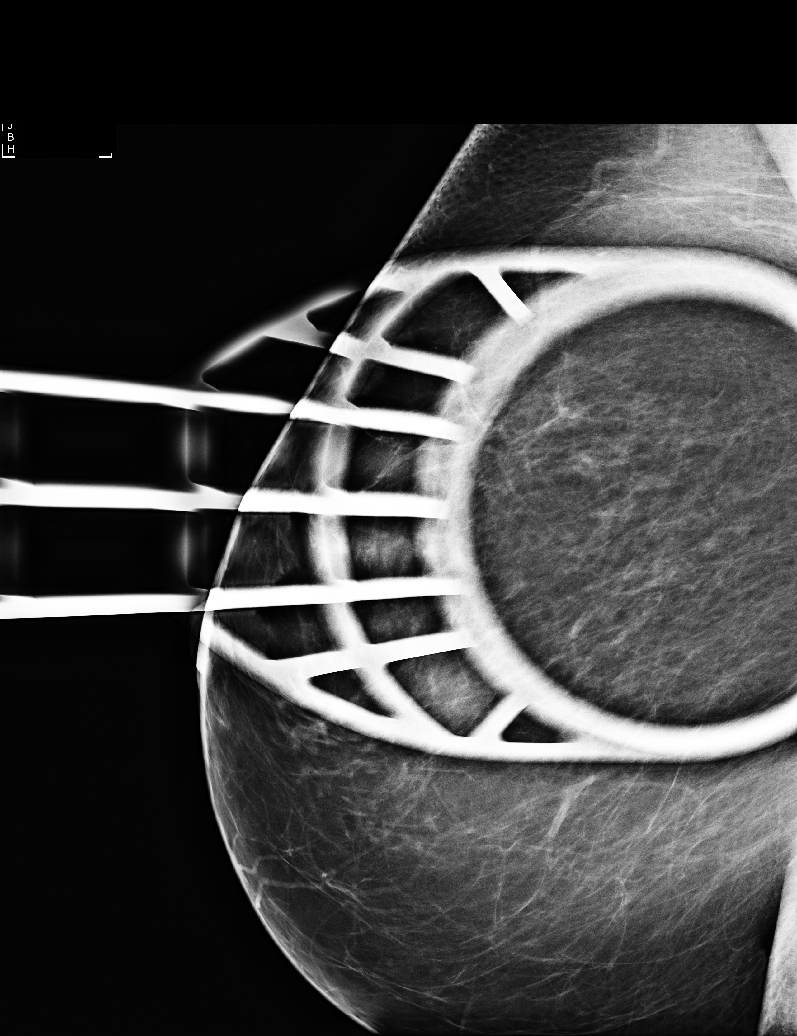

[R CC (2 of 2)]
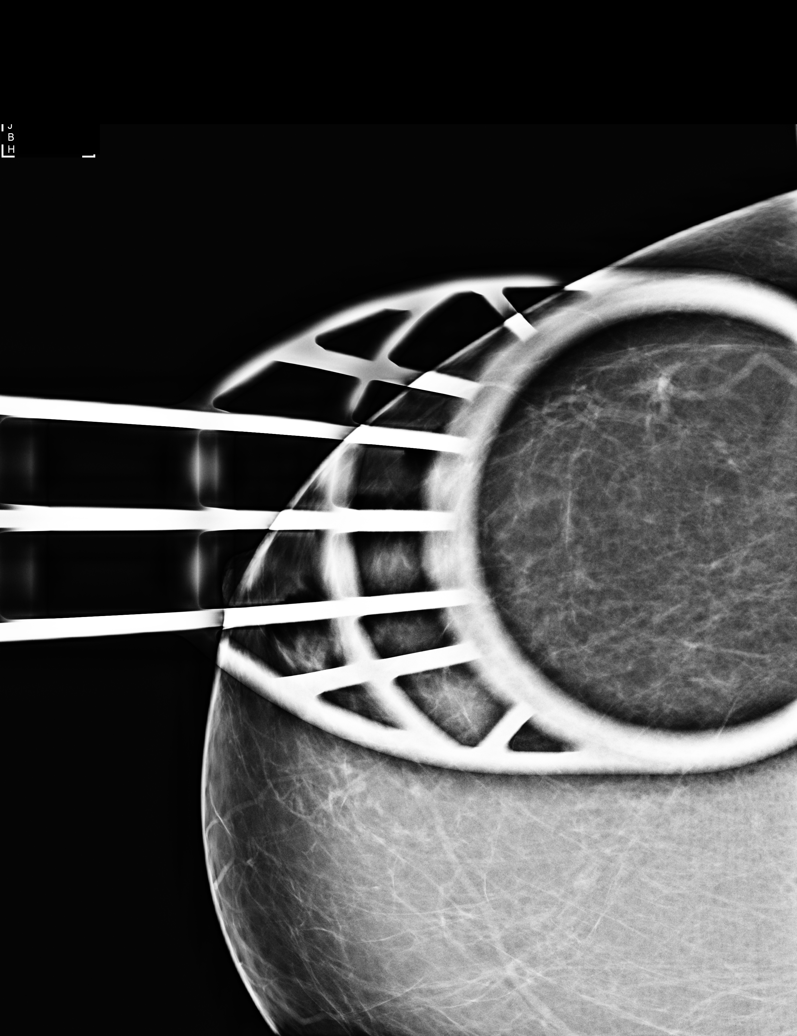

[R MLO synth-2D]
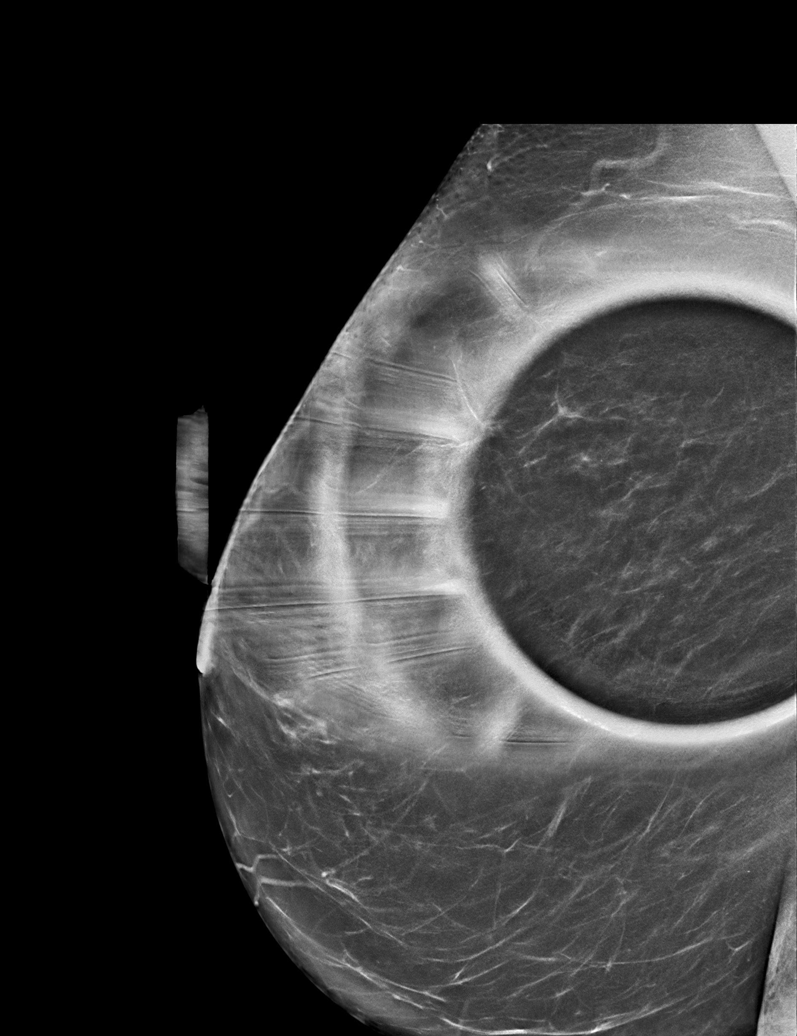

[6 of 21 positions shown; findings below may reference images not displayed]

ACR Breast Density Category b: There are scattered areas of
fibroglandular density.
FINDINGS: There is an asymmetry/possible subtle distortion in the lateral
right breast which persists on the spot compression tomosynthesis
images.

Mammographic images were processed with CAD.

Physical exam of the lateral aspect of the right breast demonstrates
no discrete palpable masses.

Ultrasound of the right breast at [DATE], 8 cm from the nipple
demonstrates a distinct area of shadowing which measures
approximately 1.0 x 0.5 x 0.5 cm.
IMPRESSION: There is a suspicious area of shadowing in the right breast at [DATE]
which likely corresponds with the possible distortion identified in
the lateral aspect of the right breast mammographically.

RECOMMENDATION:
Ultrasound-guided biopsy is recommended for the shadowing in the
right breast at [DATE]. Attention on the post biopsy mammogram is
recommended to ensure that the biopsy marking clip corresponds with
the abnormality identified mammographically. I did discuss with the
patient that if these two do not align, a stereotactic biopsy will
be recommended.

I have discussed the findings and recommendations with the patient.
Results were also provided in writing at the conclusion of the
visit. If applicable, a reminder letter will be sent to the patient
regarding the next appointment.

BI-RADS CATEGORY  Ultrasound-guided biopsy is recommended for the
right breast mass at [DATE]

BI-RADS 4:  Suspicious finding.  Biopsy is suggested.

## 2018-11-21 IMAGING — MG MM BREAST LOCALIZATION CLIP
2 series · 2 of 2 positions shown · non-contrast
Comparison: Previous exam(s).

CLINICAL DATA: Post ultrasound-guided biopsy of an indeterminate
mass in the right breast at the [DATE] position.

EXAM:
DIAGNOSTIC RIGHT MAMMOGRAM POST ULTRASOUND BIOPSY

[R ML]
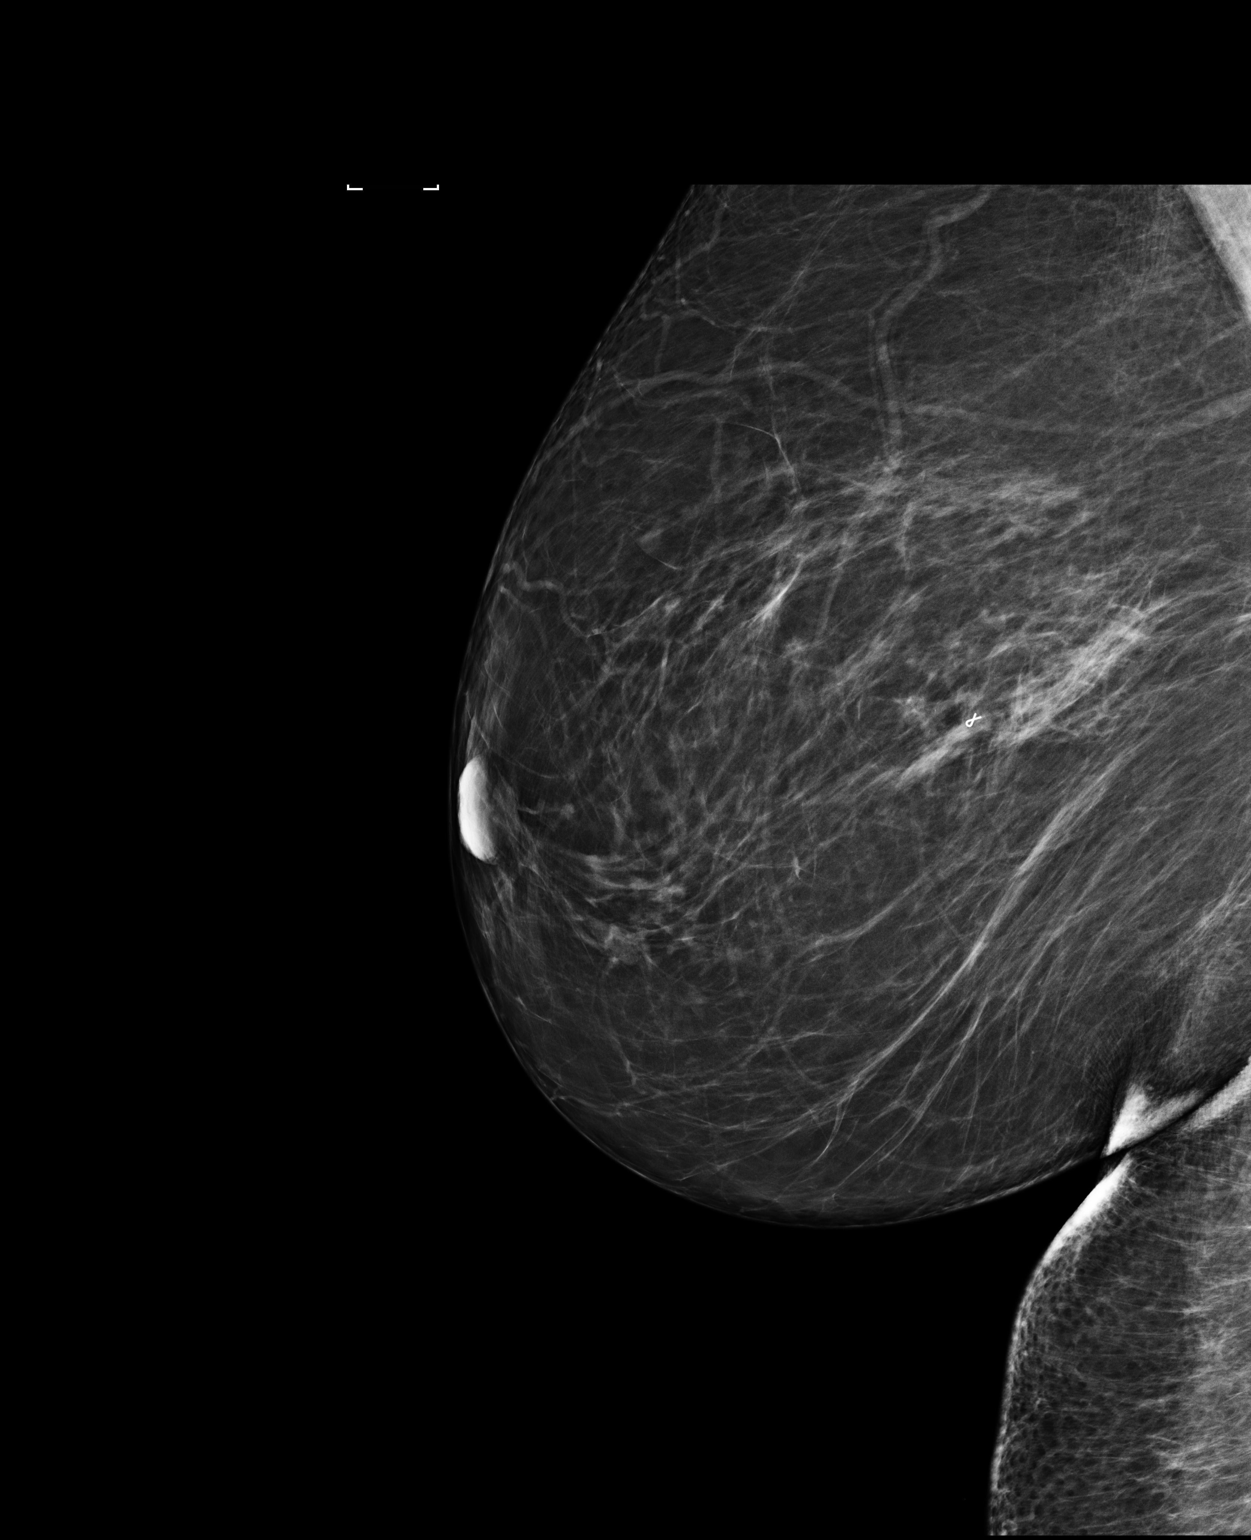

[R CC]
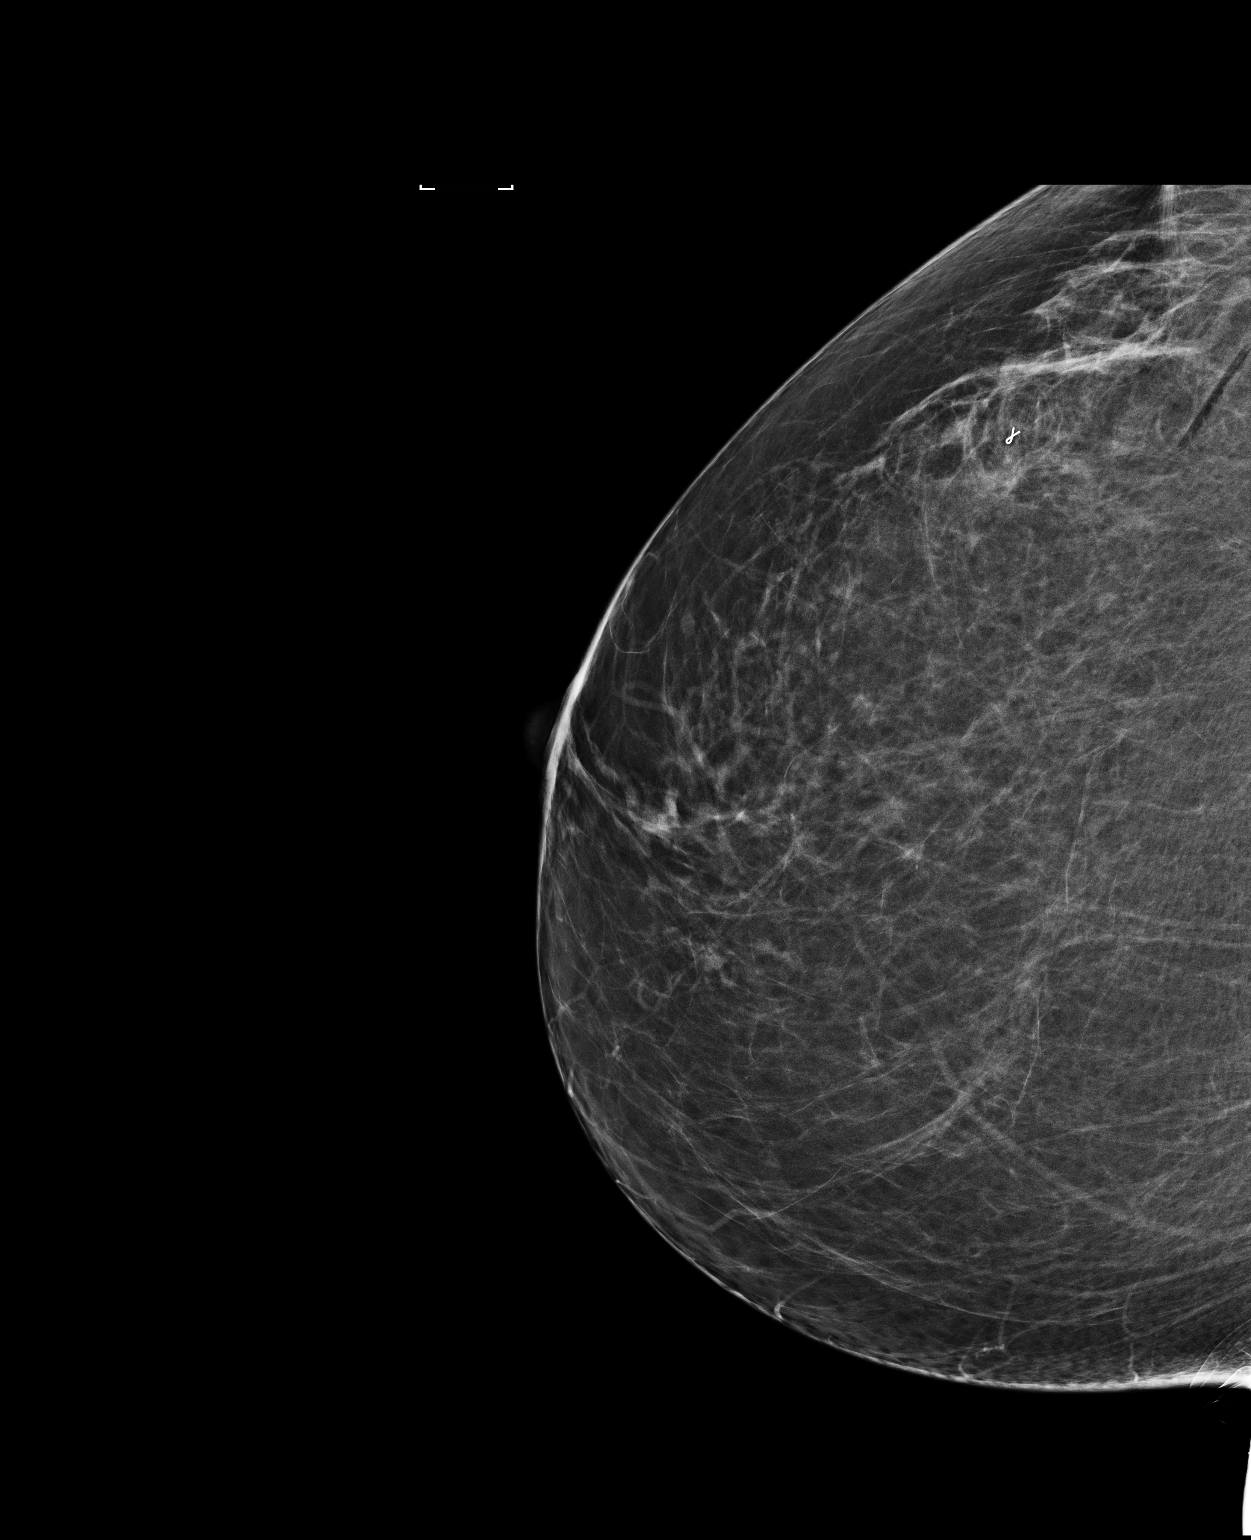

[2 of 2 positions shown; findings below may reference images not displayed]

FINDINGS: Mammographic images were obtained following ultrasound guided biopsy
of a mass in the right breast at the [DATE] position. A ribbon shaped
biopsy marking clip is present at the site of the biopsied mass in
the right breast.
IMPRESSION: Ribbon shaped biopsy marking clip at site of biopsied mass in the
right breast at the [DATE] position.

Final Assessment: Post Procedure Mammograms for Marker Placement

## 2018-12-13 ENCOUNTER — Other Ambulatory Visit: Payer: Self-pay | Admitting: Internal Medicine

## 2018-12-13 DIAGNOSIS — Z1231 Encounter for screening mammogram for malignant neoplasm of breast: Secondary | ICD-10-CM

## 2018-12-15 ENCOUNTER — Ambulatory Visit
Admission: RE | Admit: 2018-12-15 | Discharge: 2018-12-15 | Disposition: A | Discharge status: Home / Self Care | Payer: Medicare HMO | Source: Ambulatory Visit | Attending: Internal Medicine | Admitting: Internal Medicine

## 2018-12-15 DIAGNOSIS — Z1231 Encounter for screening mammogram for malignant neoplasm of breast: Secondary | ICD-10-CM | POA: Insufficient documentation

## 2020-03-17 ENCOUNTER — Other Ambulatory Visit: Payer: Self-pay | Admitting: Internal Medicine

## 2020-03-17 DIAGNOSIS — Z1231 Encounter for screening mammogram for malignant neoplasm of breast: Secondary | ICD-10-CM

## 2020-03-19 ENCOUNTER — Ambulatory Visit
Admission: RE | Admit: 2020-03-19 | Discharge: 2020-03-19 | Disposition: A | Discharge status: Home / Self Care | Payer: Medicare HMO | Source: Ambulatory Visit | Attending: Internal Medicine | Admitting: Internal Medicine

## 2020-03-19 DIAGNOSIS — Z1231 Encounter for screening mammogram for malignant neoplasm of breast: Secondary | ICD-10-CM | POA: Insufficient documentation

## 2021-02-19 ENCOUNTER — Other Ambulatory Visit: Admission: RE | Admit: 2021-02-19 | Payer: Medicare HMO | Source: Ambulatory Visit

## 2021-02-20 ENCOUNTER — Encounter: Payer: Self-pay | Admitting: *Deleted

## 2021-02-23 ENCOUNTER — Other Ambulatory Visit: Payer: Self-pay

## 2021-02-23 ENCOUNTER — Encounter
Admission: RE | Disposition: A | Discharge status: Home / Self Care | Payer: Self-pay | Source: Home / Self Care | Attending: Gastroenterology

## 2021-02-23 ENCOUNTER — Ambulatory Visit: Payer: Medicare HMO | Admitting: Certified Registered Nurse Anesthetist

## 2021-02-23 ENCOUNTER — Ambulatory Visit
Admission: RE | Admit: 2021-02-23 | Discharge: 2021-02-23 | Disposition: A | Discharge status: Home / Self Care | Payer: Medicare HMO | Attending: Gastroenterology | Admitting: Gastroenterology

## 2021-02-23 ENCOUNTER — Encounter: Payer: Self-pay | Admitting: *Deleted

## 2021-02-23 DIAGNOSIS — Z880 Allergy status to penicillin: Secondary | ICD-10-CM | POA: Insufficient documentation

## 2021-02-23 DIAGNOSIS — Z79899 Other long term (current) drug therapy: Secondary | ICD-10-CM | POA: Insufficient documentation

## 2021-02-23 DIAGNOSIS — K640 First degree hemorrhoids: Secondary | ICD-10-CM | POA: Diagnosis not present

## 2021-02-23 DIAGNOSIS — K5730 Diverticulosis of large intestine without perforation or abscess without bleeding: Secondary | ICD-10-CM | POA: Diagnosis not present

## 2021-02-23 DIAGNOSIS — Z91041 Radiographic dye allergy status: Secondary | ICD-10-CM | POA: Diagnosis not present

## 2021-02-23 DIAGNOSIS — K219 Gastro-esophageal reflux disease without esophagitis: Secondary | ICD-10-CM | POA: Insufficient documentation

## 2021-02-23 DIAGNOSIS — Z1211 Encounter for screening for malignant neoplasm of colon: Secondary | ICD-10-CM | POA: Insufficient documentation

## 2021-02-23 DIAGNOSIS — Z885 Allergy status to narcotic agent status: Secondary | ICD-10-CM | POA: Insufficient documentation

## 2021-02-23 DIAGNOSIS — D120 Benign neoplasm of cecum: Secondary | ICD-10-CM | POA: Diagnosis not present

## 2021-02-23 DIAGNOSIS — Z7982 Long term (current) use of aspirin: Secondary | ICD-10-CM | POA: Diagnosis not present

## 2021-02-23 DIAGNOSIS — D122 Benign neoplasm of ascending colon: Secondary | ICD-10-CM | POA: Insufficient documentation

## 2021-02-23 HISTORY — DX: Depression, unspecified: F32.A

## 2021-02-23 HISTORY — DX: Benign neoplasm of colon, unspecified: D12.6

## 2021-02-23 HISTORY — DX: Anxiety disorder, unspecified: F41.9

## 2021-02-23 HISTORY — DX: Hyperlipidemia, unspecified: E78.5

## 2021-02-23 HISTORY — PX: COLONOSCOPY WITH PROPOFOL: SHX5780

## 2021-02-23 SURGERY — COLONOSCOPY WITH PROPOFOL
Anesthesia: General

## 2021-02-23 MED ORDER — PROPOFOL 500 MG/50ML IV EMUL
INTRAVENOUS | Status: DC | PRN
  Administered 2021-02-23: 150 ug/kg/min via INTRAVENOUS

## 2021-02-23 MED ORDER — LIDOCAINE HCL (PF) 2 % IJ SOLN
INTRAMUSCULAR | Status: AC
  Filled 2021-02-23: qty 5

## 2021-02-23 MED ORDER — PROPOFOL 500 MG/50ML IV EMUL
INTRAVENOUS | Status: AC
  Filled 2021-02-23: qty 50

## 2021-02-23 MED ORDER — SODIUM CHLORIDE 0.9 % IV SOLN: INTRAVENOUS | Status: DC

## 2021-02-23 MED ORDER — ONDANSETRON HCL 4 MG/2ML IJ SOLN: 4.0000 mg | Freq: Once | INTRAMUSCULAR | Status: DC | PRN

## 2021-02-23 MED ORDER — LIDOCAINE HCL (CARDIAC) PF 100 MG/5ML IV SOSY
PREFILLED_SYRINGE | INTRAVENOUS | Status: DC | PRN
  Administered 2021-02-23: 50 mg via INTRAVENOUS

## 2021-02-23 MED ORDER — FENTANYL CITRATE (PF) 100 MCG/2ML IJ SOLN: 25.0000 ug | INTRAMUSCULAR | Status: DC | PRN

## 2021-02-23 MED ORDER — PHENYLEPHRINE HCL (PRESSORS) 10 MG/ML IV SOLN
INTRAVENOUS | Status: DC | PRN
  Administered 2021-02-23 (×2): 100 ug via INTRAVENOUS

## 2021-02-23 MED ORDER — PROPOFOL 10 MG/ML IV BOLUS
INTRAVENOUS | Status: DC | PRN
  Administered 2021-02-23: 30 mg via INTRAVENOUS
  Administered 2021-02-23: 70 mg via INTRAVENOUS
  Administered 2021-02-23: 10 mg via INTRAVENOUS
  Administered 2021-02-23: 20 mg via INTRAVENOUS

## 2021-02-23 NOTE — Anesthesia Preprocedure Evaluation (Signed)
Anesthesia Evaluation  Patient identified by MRN, date of birth, ID band Patient awake    Reviewed: Allergy & Precautions, NPO status , Patient's Chart, lab work & pertinent test results  Airway Mallampati: II       Dental  (+) Teeth Intact   Pulmonary neg pulmonary ROS,    breath sounds clear to auscultation       Cardiovascular Exercise Tolerance: Good  Rhythm:Regular     Neuro/Psych PSYCHIATRIC DISORDERS Anxiety Depression negative neurological ROS     GI/Hepatic Neg liver ROS, GERD  Medicated,  Endo/Other  negative endocrine ROS  Renal/GU negative Renal ROS     Musculoskeletal  (+) Arthritis ,   Abdominal   Peds negative pediatric ROS (+)  Hematology   Anesthesia Other Findings   Reproductive/Obstetrics                             Anesthesia Physical  Anesthesia Plan  ASA: II  Anesthesia Plan: General   Post-op Pain Management:    Induction: Intravenous  PONV Risk Score and Plan: Propofol infusion  Airway Management Planned: Nasal Cannula  Additional Equipment:   Intra-op Plan:   Post-operative Plan:   Informed Consent: I have reviewed the patients History and Physical, chart, labs and discussed the procedure including the risks, benefits and alternatives for the proposed anesthesia with the patient or authorized representative who has indicated his/her understanding and acceptance.       Plan Discussed with: CRNA  Anesthesia Plan Comments:         Anesthesia Quick Evaluation

## 2021-02-23 NOTE — Interval H&P Note (Signed)
History and Physical Interval Note:  02/23/2021 8:48 AM  Jillian Armstrong  has presented today for surgery, with the diagnosis of HX ADEN POLYPS.  The various methods of treatment have been discussed with the patient and family. After consideration of risks, benefits and other options for treatment, the patient has consented to  Procedure(s): COLONOSCOPY WITH PROPOFOL (N/A) as a surgical intervention.  The patient's history has been reviewed, patient examined, no change in status, stable for surgery.  I have reviewed the patient's chart and labs.  Questions were answered to the patient's satisfaction.     [name]  Ok to proceed with colonoscopy

## 2021-02-23 NOTE — H&P (Signed)
Outpatient short stay form Pre-procedure 02/23/2021 8:45 AM Raylene Miyamoto MD, MPH  Primary Physician: Dr. Sabra Heck  Reason for visit:  Surveillance colonoscopy  History of present illness:   70 y/o lady with history of multiple polyps here for surveillance colonoscopy. No family history of GI malignancies. History of hysterectomy. No blood thinners.    Current Facility-Administered Medications:  .  0.9 %  sodium chloride infusion, , Intravenous, Continuous, Sumayya Muha, Hilton Cork, MD  Medications Prior to Admission  Medication Sig Dispense Refill Last Dose  . acetaminophen (TYLENOL) 325 MG tablet Take 325 mg by mouth at bedtime.    Past Week at Unknown time  . aspirin EC 81 MG tablet Take 81 mg by mouth daily.   Past Week at Unknown time  . b complex vitamins tablet Take 1 tablet by mouth as needed (PT DOES NOT TAKE CONSISTENTLY).    Past Week at Unknown time  . Calcium Carbonate (CALCIUM 600 PO) Take 1 tablet by mouth as needed (PT DOES NOT TAKE CONSISTENTLY).   Past Week at Unknown time  . Cholecalciferol (VITAMIN D PO) Take 1 tablet by mouth as needed (DOES NOT TAKE CONSISTENTLY).   Past Week at Unknown time  . estradiol (ESTRACE) 1 MG tablet    Past Week at Unknown time  . metoprolol succinate (TOPROL-XL) 25 MG 24 hr tablet Take 12.5 mg by mouth daily.   02/22/2021 at 1800  . Omega-3 Fatty Acids (FISH OIL PO) Take 1 tablet by mouth as needed (PT DOES NOT TAKE CONSISTENTLY).    Past Month at Unknown time  . omeprazole (PRILOSEC) 20 MG capsule Take 20 mg by mouth as needed.    Past Week at Unknown time  . senna-docusate (SENOKOT-S) 8.6-50 MG tablet Take 2-3 tablets by mouth at bedtime.    Past Week at Unknown time  . Simethicone (GAS-X PO) Take 1 tablet by mouth as needed.   Past Week at Unknown time  . SUCRALFATE PO Take 1 tablet by mouth as needed.    Past Week at Unknown time  . HYDROcodone-acetaminophen (NORCO) 5-325 MG tablet Take 1-2 tablets by mouth every 4 (four) hours as needed  for moderate pain. (Patient not taking: Reported on 02/23/2021) 30 tablet 0 Completed Course at Unknown time     Allergies  Allergen Reactions  . Contrast Media [Iodinated Diagnostic Agents] Shortness Of Breath  . Morphine Hives  . Codeine Rash    Hyper   . Penicillins Rash     Past Medical History:  Diagnosis Date  . Adenomatous colon polyp   . Anxiety   . Arthritis    hands bil  . Depression   . Fracture    right arm  . GERD (gastroesophageal reflux disease)   . Hyperlipemia     Review of systems:  Otherwise negative.    Physical Exam  Gen: Alert, oriented. Appears stated age.  HEENT: PERRLA. Lungs: No respiratory distress CV: RRR Abd: soft, benign, no masses Ext: No edema    Planned procedures: Proceed with colonoscopy. The patient understands the nature of the planned procedure, indications, risks, alternatives and potential complications including but not limited to bleeding, infection, perforation, damage to internal organs and possible oversedation/side effects from anesthesia. The patient agrees and gives consent to proceed.  Please refer to procedure notes for findings, recommendations and patient disposition/instructions.     Raylene Miyamoto MD, MPH Gastroenterology 02/23/2021  8:45 AM

## 2021-02-23 NOTE — Transfer of Care (Signed)
Immediate Anesthesia Transfer of Care Note  Patient: Jillian Armstrong  Procedure(s) Performed: COLONOSCOPY WITH PROPOFOL (N/A )  Patient Location: PACU  Anesthesia Type:General  Level of Consciousness: sedated  Airway & Oxygen Therapy: Patient Spontanous Breathing  Post-op Assessment: Report given to RN and Post -op Vital signs reviewed and stable  Post vital signs: Reviewed and stable  Last Vitals:  Vitals Value Taken Time  BP 102/62 02/23/21 0923  Temp 36.1 C 02/23/21 0922  Pulse 63 02/23/21 0923  Resp 16 02/23/21 0923  SpO2 98 % 02/23/21 0923    Last Pain:  Vitals:   02/23/21 0922  TempSrc: Tympanic  PainSc: Asleep         Complications: No complications documented.

## 2021-02-23 NOTE — Op Note (Signed)
[facility] Gastroenterology Patient Name: Jillian Armstrong Procedure Date: 02/23/2021 8:40 AM MRN: [id number] Account #: [id number] Date of Birth: [birth year] Admit Type: Outpatient Age: 70 Room: [facility] ENDO ROOM 3 Gender: Female Note Status: Finalized Procedure:             Colonoscopy Indications:           High risk colon cancer surveillance: Personal history                         of colonic polyps Providers:             Andrey Farmer MD, MD Referring MD:          Rusty Aus, MD (Referring MD) Medicines:             Monitored Anesthesia Care Complications:         No immediate complications. Estimated blood loss:                         Minimal. Procedure:             Pre-Anesthesia Assessment:                        - Prior to the procedure, a History and Physical was                         performed, and patient medications and allergies were                         reviewed. The patient is competent. The risks and                         benefits of the procedure and the sedation options and                         risks were discussed with the patient. All questions                         were answered and informed consent was obtained.                         Patient identification and proposed procedure were                         verified by the physician, the nurse, the anesthetist                         and the technician in the endoscopy suite. Mental                         Status Examination: alert and oriented. Airway                         Examination: normal oropharyngeal airway and neck                         mobility. Respiratory Examination: clear to  auscultation. CV Examination: normal. Prophylactic                         Antibiotics: The patient does not require prophylactic                         antibiotics. Prior Anticoagulants: The patient has                         taken no previous anticoagulant or  antiplatelet                         agents. ASA Grade Assessment: II - A patient with mild                         systemic disease. After reviewing the risks and                         benefits, the patient was deemed in satisfactory                         condition to undergo the procedure. The anesthesia                         plan was to use monitored anesthesia care (MAC).                         Immediately prior to administration of medications,                         the patient was re-assessed for adequacy to receive                         sedatives. The heart rate, respiratory rate, oxygen                         saturations, blood pressure, adequacy of pulmonary                         ventilation, and response to care were monitored                         throughout the procedure. The physical status of the                         patient was re-assessed after the procedure.                        After obtaining informed consent, the colonoscope was                         passed under direct vision. Throughout the procedure,                         the patient's blood pressure, pulse, and oxygen                         saturations were monitored continuously. The  Colonoscope was introduced through the anus and                         advanced to the the cecum, identified by appendiceal                         orifice and ileocecal valve. The colonoscopy was                         performed without difficulty. The patient tolerated                         the procedure well. The quality of the bowel                         preparation was good. Findings:      The perianal and digital rectal examinations were normal.      A less than 1 mm polyp was found in the cecum. The polyp was sessile.       The polyp was removed with a jumbo cold forceps. Resection and retrieval       were complete. Estimated blood loss was minimal.      Two sessile polyps  were found in the ascending colon. The polyps were 1       mm in size. These polyps were removed with a jumbo cold forceps.       Resection and retrieval were complete. Estimated blood loss was minimal.      A 3 mm polyp was found in the ascending colon. The polyp was sessile.       The polyp was removed with a cold snare. Resection and retrieval were       complete. Estimated blood loss was minimal.      A single small-mouthed diverticulum was found in the sigmoid colon.      Internal hemorrhoids were found during retroflexion. The hemorrhoids       were Grade I (internal hemorrhoids that do not prolapse).      The exam was otherwise without abnormality on direct and retroflexion       views. Impression:            - One less than 1 mm polyp in the cecum, removed with                         a jumbo cold forceps. Resected and retrieved.                        - Two 1 mm polyps in the ascending colon, removed with                         a jumbo cold forceps. Resected and retrieved.                        - One 3 mm polyp in the ascending colon, removed with                         a cold snare. Resected and retrieved.                        - Diverticulosis  in the sigmoid colon.                        - Internal hemorrhoids.                        - The examination was otherwise normal on direct and                         retroflexion views. Recommendation:        - Discharge patient to home.                        - Resume previous diet.                        - Continue present medications.                        - Await pathology results.                        - Repeat colonoscopy for surveillance based on                         pathology results.                        - Return to referring physician as previously                         scheduled. Procedure Code(s):     --- Professional ---                        [phone], Colonoscopy, flexible; with removal of                          tumor(s), polyp(s), or other lesion(s) by snare                         technique                        45380, 66, Colonoscopy, flexible; with biopsy, single                         or multiple Diagnosis Code(s):     --- Professional ---                        K63.5, Polyp of colon                        Z86.010, Personal history of colonic polyps                        K64.0, First degree hemorrhoids                        K57.30, Diverticulosis of large intestine without                         perforation or abscess without bleeding CPT copyright 2019 American Medical Association.  All rights reserved. The codes documented in this report are preliminary and upon coder review may  be revised to meet current compliance requirements. Andrey Farmer MD, MD 02/23/2021 9:20:55 AM Number of Addenda: 0 Note Initiated On: 02/23/2021 8:40 AM Scope Withdrawal Time: 0 hours 15 minutes 13 seconds  Total Procedure Duration: 0 hours 21 minutes 46 seconds  Estimated Blood Loss:  Estimated blood loss was minimal.      [facility]

## 2021-02-23 NOTE — Anesthesia Postprocedure Evaluation (Signed)
Anesthesia Post Note  Patient: Jillian Armstrong  Procedure(s) Performed: COLONOSCOPY WITH PROPOFOL (N/A )  Patient location during evaluation: Endoscopy Anesthesia Type: General Level of consciousness: awake and alert and oriented Pain management: pain level controlled Vital Signs Assessment: post-procedure vital signs reviewed and stable Respiratory status: spontaneous breathing Cardiovascular status: blood pressure returned to baseline Anesthetic complications: no   No complications documented.   Last Vitals:  Vitals:   02/23/21 0942 02/23/21 0952  BP: 102/69 123/74  Pulse: 60 (!) 58  Resp: 14 16  Temp:    SpO2: 97% 99%    Last Pain:  Vitals:   02/23/21 0952  TempSrc:   PainSc: 0-No pain                 Omnia Dollinger

## 2021-02-24 LAB — SURGICAL PATHOLOGY

## 2021-02-25 ENCOUNTER — Encounter: Payer: Self-pay | Admitting: Gastroenterology

## 2022-03-08 ENCOUNTER — Other Ambulatory Visit: Payer: Self-pay | Admitting: Internal Medicine

## 2022-03-08 DIAGNOSIS — R319 Hematuria, unspecified: Secondary | ICD-10-CM

## 2022-03-17 ENCOUNTER — Ambulatory Visit
Admission: RE | Admit: 2022-03-17 | Discharge: 2022-03-17 | Disposition: A | Discharge status: Home / Self Care | Payer: Medicare HMO | Source: Ambulatory Visit | Attending: Internal Medicine | Admitting: Internal Medicine

## 2022-03-17 DIAGNOSIS — R319 Hematuria, unspecified: Secondary | ICD-10-CM

## 2022-03-24 ENCOUNTER — Other Ambulatory Visit (HOSPITAL_COMMUNITY): Payer: Self-pay | Admitting: Physician Assistant

## 2022-03-24 DIAGNOSIS — R079 Chest pain, unspecified: Secondary | ICD-10-CM

## 2022-03-31 ENCOUNTER — Telehealth (HOSPITAL_COMMUNITY): Payer: Self-pay | Admitting: Emergency Medicine

## 2022-03-31 DIAGNOSIS — Z91041 Radiographic dye allergy status: Secondary | ICD-10-CM

## 2022-03-31 DIAGNOSIS — R079 Chest pain, unspecified: Secondary | ICD-10-CM

## 2022-03-31 MED ORDER — METOPROLOL TARTRATE 100 MG PO TABS: 100.0000 mg | ORAL_TABLET | Freq: Once | ORAL | 0 refills | Status: AC

## 2022-03-31 MED ORDER — DIPHENHYDRAMINE HCL 50 MG PO TABS: ORAL_TABLET | ORAL | 0 refills | Status: AC

## 2022-03-31 MED ORDER — IVABRADINE HCL 5 MG PO TABS: 15.0000 mg | ORAL_TABLET | Freq: Once | ORAL | 0 refills | Status: AC

## 2022-03-31 MED ORDER — PREDNISONE 50 MG PO TABS: ORAL_TABLET | ORAL | 0 refills | Status: AC

## 2022-03-31 NOTE — Telephone Encounter (Signed)
Reaching out to patient to offer assistance regarding upcoming cardiac imaging study; pt verbalizes understanding of appt date/time, parking situation and where to check in, pre-test NPO status and medications ordered, and verified current allergies; name and call back number provided for further questions should they arise Marchia Bond RN Navigator Cardiac Imaging [facility] [phone] office [phone] cell  Denies iv issues Contrast allergy : 13 hr prep + '100mg'$  metop + '15mg'$  ivabradine Arrival 1200

## 2022-04-01 ENCOUNTER — Ambulatory Visit (HOSPITAL_COMMUNITY)
Admission: RE | Admit: 2022-04-01 | Discharge: 2022-04-01 | Disposition: A | Discharge status: Home / Self Care | Payer: Medicare HMO | Source: Ambulatory Visit | Attending: Physician Assistant | Admitting: Physician Assistant

## 2022-04-01 ENCOUNTER — Other Ambulatory Visit (HOSPITAL_COMMUNITY): Payer: Medicare HMO

## 2022-04-01 ENCOUNTER — Ambulatory Visit: Payer: Medicare HMO

## 2022-04-01 DIAGNOSIS — R079 Chest pain, unspecified: Secondary | ICD-10-CM | POA: Diagnosis not present

## 2022-04-01 DIAGNOSIS — I2510 Atherosclerotic heart disease of native coronary artery without angina pectoris: Secondary | ICD-10-CM | POA: Diagnosis not present

## 2022-04-01 MED ORDER — DILTIAZEM HCL 25 MG/5ML IV SOLN
INTRAVENOUS | Status: AC
  Administered 2022-04-01: 10 mg via INTRAVENOUS
  Filled 2022-04-01: qty 5

## 2022-04-01 MED ORDER — METOPROLOL TARTRATE 5 MG/5ML IV SOLN
INTRAVENOUS | Status: AC
  Administered 2022-04-01: 10 mg via INTRAVENOUS
  Filled 2022-04-01: qty 20

## 2022-04-01 MED ORDER — DILTIAZEM HCL 25 MG/5ML IV SOLN
10.0000 mg | INTRAVENOUS | Status: DC | PRN
  Administered 2022-04-01: 10 mg via INTRAVENOUS

## 2022-04-01 MED ORDER — NITROGLYCERIN 0.4 MG SL SUBL: 0.8000 mg | SUBLINGUAL_TABLET | Freq: Once | SUBLINGUAL | Status: AC

## 2022-04-01 MED ORDER — METOPROLOL TARTRATE 5 MG/5ML IV SOLN
10.0000 mg | INTRAVENOUS | Status: DC | PRN
  Administered 2022-04-01: 10 mg via INTRAVENOUS

## 2022-04-01 MED ORDER — NITROGLYCERIN 0.4 MG SL SUBL
SUBLINGUAL_TABLET | SUBLINGUAL | Status: AC
  Administered 2022-04-01: 0.8 mg via SUBLINGUAL
  Filled 2022-04-01: qty 2

## 2022-04-01 MED ORDER — METOPROLOL TARTRATE 5 MG/5ML IV SOLN
INTRAVENOUS | Status: AC
  Filled 2022-04-01: qty 5

## 2022-04-01 MED ORDER — IOHEXOL 350 MG/ML SOLN
100.0000 mL | Freq: Once | INTRAVENOUS | Status: AC | PRN
  Administered 2022-04-01: 100 mL via INTRAVENOUS

## 2022-08-25 ENCOUNTER — Other Ambulatory Visit: Payer: Self-pay | Admitting: Internal Medicine

## 2022-08-25 DIAGNOSIS — Z1231 Encounter for screening mammogram for malignant neoplasm of breast: Secondary | ICD-10-CM

## 2022-09-03 ENCOUNTER — Ambulatory Visit: Payer: Medicare HMO | Admitting: Anesthesiology

## 2022-09-03 ENCOUNTER — Other Ambulatory Visit: Payer: Self-pay

## 2022-09-03 ENCOUNTER — Encounter
Admission: RE | Disposition: A | Discharge status: Home / Self Care | Payer: Self-pay | Source: Home / Self Care | Attending: Gastroenterology

## 2022-09-03 ENCOUNTER — Ambulatory Visit
Admission: RE | Admit: 2022-09-03 | Discharge: 2022-09-03 | Disposition: A | Discharge status: Home / Self Care | Payer: Medicare HMO | Attending: Gastroenterology | Admitting: Gastroenterology

## 2022-09-03 ENCOUNTER — Encounter: Payer: Self-pay | Admitting: *Deleted

## 2022-09-03 DIAGNOSIS — Z90710 Acquired absence of both cervix and uterus: Secondary | ICD-10-CM | POA: Diagnosis not present

## 2022-09-03 DIAGNOSIS — I10 Essential (primary) hypertension: Secondary | ICD-10-CM | POA: Insufficient documentation

## 2022-09-03 DIAGNOSIS — I252 Old myocardial infarction: Secondary | ICD-10-CM | POA: Diagnosis not present

## 2022-09-03 DIAGNOSIS — K222 Esophageal obstruction: Secondary | ICD-10-CM | POA: Insufficient documentation

## 2022-09-03 DIAGNOSIS — K449 Diaphragmatic hernia without obstruction or gangrene: Secondary | ICD-10-CM | POA: Insufficient documentation

## 2022-09-03 DIAGNOSIS — E785 Hyperlipidemia, unspecified: Secondary | ICD-10-CM | POA: Diagnosis not present

## 2022-09-03 DIAGNOSIS — K219 Gastro-esophageal reflux disease without esophagitis: Secondary | ICD-10-CM | POA: Insufficient documentation

## 2022-09-03 DIAGNOSIS — Z86010 Personal history of colonic polyps: Secondary | ICD-10-CM | POA: Diagnosis not present

## 2022-09-03 DIAGNOSIS — E669 Obesity, unspecified: Secondary | ICD-10-CM | POA: Diagnosis not present

## 2022-09-03 DIAGNOSIS — R1310 Dysphagia, unspecified: Secondary | ICD-10-CM | POA: Insufficient documentation

## 2022-09-03 DIAGNOSIS — Z6837 Body mass index (BMI) 37.0-37.9, adult: Secondary | ICD-10-CM | POA: Diagnosis not present

## 2022-09-03 DIAGNOSIS — K200 Eosinophilic esophagitis: Secondary | ICD-10-CM | POA: Insufficient documentation

## 2022-09-03 HISTORY — PX: ESOPHAGOGASTRODUODENOSCOPY (EGD) WITH PROPOFOL: SHX5813

## 2022-09-03 SURGERY — ESOPHAGOGASTRODUODENOSCOPY (EGD) WITH PROPOFOL
Anesthesia: General

## 2022-09-03 MED ORDER — PROPOFOL 10 MG/ML IV BOLUS
INTRAVENOUS | Status: AC | PRN
  Administered 2022-09-03 (×2): 20 mg via INTRAVENOUS
  Administered 2022-09-03: 100 mg via INTRAVENOUS

## 2022-09-03 MED ORDER — SODIUM CHLORIDE 0.9 % IV SOLN: INTRAVENOUS | Status: DC

## 2022-09-03 NOTE — Interval H&P Note (Signed)
History and Physical Interval Note:  09/03/2022 10:55 AM  Jillian Armstrong  has presented today for surgery, with the diagnosis of pharyngoesophageal dysphagia.  The various methods of treatment have been discussed with the patient and family. After consideration of risks, benefits and other options for treatment, the patient has consented to  Procedure(s): ESOPHAGOGASTRODUODENOSCOPY (EGD) WITH PROPOFOL (N/A) as a surgical intervention.  The patient's history has been reviewed, patient examined, no change in status, stable for surgery.  I have reviewed the patient's chart and labs.  Questions were answered to the patient's satisfaction.     [name]  Ok to proceed with EGD

## 2022-09-03 NOTE — Op Note (Signed)
[facility] Gastroenterology Patient Name: Jillian Armstrong Procedure Date: 09/03/2022 10:43 AM MRN: [id number] Account #: [id number] Date of Birth: Jan 03, 1951 Admit Type: Outpatient Age: 71 Room: [facility] ENDO ROOM 3 Gender: Female Note Status: Finalized Instrument Name: Upper Endoscope [id number] Procedure:             Upper GI endoscopy Indications:           Dysphagia Providers:             Andrey Farmer MD, MD Medicines:             Monitored Anesthesia Care Complications:         No immediate complications. Estimated blood loss:                         Minimal. Procedure:             Pre-Anesthesia Assessment:                        - Prior to the procedure, a History and Physical was                         performed, and patient medications and allergies were                         reviewed. The patient is competent. The risks and                         benefits of the procedure and the sedation options and                         risks were discussed with the patient. All questions                         were answered and informed consent was obtained.                         Patient identification and proposed procedure were                         verified by the physician, the nurse, the                         anesthesiologist, the anesthetist and the technician                         in the endoscopy suite. Mental Status Examination:                         alert and oriented. Airway Examination: normal                         oropharyngeal airway and neck mobility. Respiratory                         Examination: clear to auscultation. CV Examination:                         normal. Prophylactic Antibiotics: The patient does not  require prophylactic antibiotics. Prior                         Anticoagulants: The patient has taken no anticoagulant                         or antiplatelet agents. ASA Grade Assessment: III - A                          patient with severe systemic disease. After reviewing                         the risks and benefits, the patient was deemed in                         satisfactory condition to undergo the procedure. The                         anesthesia plan was to use monitored anesthesia care                         (MAC). Immediately prior to administration of                         medications, the patient was re-assessed for adequacy                         to receive sedatives. The heart rate, respiratory                         rate, oxygen saturations, blood pressure, adequacy of                         pulmonary ventilation, and response to care were                         monitored throughout the procedure. The physical                         status of the patient was re-assessed after the                         procedure.                        After obtaining informed consent, the endoscope was                         passed under direct vision. Throughout the procedure,                         the patient's blood pressure, pulse, and oxygen                         saturations were monitored continuously. The Endoscope                         was introduced through the mouth, and advanced to the  second part of duodenum. The upper GI endoscopy was                         accomplished without difficulty. The patient tolerated                         the procedure well. Findings:      A non-obstructing Schatzki ring was found in the lower third of the       esophagus. A TTS dilator was passed through the scope. Dilation with an       18-19-20 mm balloon dilator was performed to 18 mm. The dilation site       was examined and showed mild mucosal disruption. Estimated blood loss       was minimal.      Normal mucosa was found in the entire esophagus. Biopsies were obtained       from the proximal and distal esophagus with cold forceps for histology        of suspected eosinophilic esophagitis. Estimated blood loss was minimal.      A small hiatal hernia was present.      The entire examined stomach was normal.      The examined duodenum was normal. Impression:            - Non-obstructing Schatzki ring. Dilated.                        - Normal mucosa was found in the entire esophagus.                        - Small hiatal hernia.                        - Normal stomach.                        - Normal examined duodenum.                        - Biopsies were taken with a cold forceps for                         evaluation of eosinophilic esophagitis. Recommendation:        - Discharge patient to home.                        - Resume previous diet.                        - Continue present medications.                        - Await pathology results.                        - Return to referring physician as previously                         scheduled. Procedure Code(s):     --- Professional ---                        [phone], Esophagogastroduodenoscopy, flexible,  transoral; with transendoscopic balloon dilation of                         esophagus (less than 30 mm diameter) Diagnosis Code(s):     --- Professional ---                        K22.2, Esophageal obstruction                        K44.9, Diaphragmatic hernia without obstruction or                         gangrene                        R13.10, Dysphagia, unspecified CPT copyright 2022 American Medical Association. All rights reserved. The codes documented in this report are preliminary and upon coder review may  be revised to meet current compliance requirements. Andrey Farmer MD, MD 09/03/2022 11:15:30 AM Number of Addenda: 0 Note Initiated On: 09/03/2022 10:43 AM Estimated Blood Loss:  Estimated blood loss was minimal.      [facility]

## 2022-09-03 NOTE — H&P (Signed)
Outpatient short stay form Pre-procedure 09/03/2022  [name], MD  Primary Physician: Rusty Aus, MD  Reason for visit:  Dysphagia  History of present illness:    71 y/o lady with history of obesity, GERD, and arthritis here for EGD for solid food dysphagia. No blood thinners. No family history of GI malignancies. No neck surgeries. She is having TMJ problems.    Current Facility-Administered Medications:    0.9 %  sodium chloride infusion, , Intravenous, Continuous, Lani Havlik, Hilton Cork, MD, Last Rate: 20 mL/hr at 09/03/22 1013, New Bag at 09/03/22 1013  Medications Prior to Admission  Medication Sig Dispense Refill Last Dose   acetaminophen (TYLENOL) 325 MG tablet Take 325 mg by mouth at bedtime.    09/02/2022   aspirin EC 81 MG tablet Take 81 mg by mouth daily.   Past Week   b complex vitamins tablet Take 1 tablet by mouth as needed (PT DOES NOT TAKE CONSISTENTLY).    Past Week   Calcium Carbonate (CALCIUM 600 PO) Take 1 tablet by mouth as needed (PT DOES NOT TAKE CONSISTENTLY).   Past Week   Cholecalciferol (VITAMIN D PO) Take 1 tablet by mouth as needed (DOES NOT TAKE CONSISTENTLY).   Past Week   diphenhydrAMINE (BENADRYL) 50 MG tablet Take 1 tablet ('50mg'$ ) 1 hr prior to CT appt 1 tablet 0 Past Month   estradiol (ESTRACE) 1 MG tablet    09/02/2022   fexofenadine (ALLEGRA ALLERGY) 60 MG tablet Take 60 mg by mouth 2 (two) times daily.   09/02/2022   metoprolol succinate (TOPROL-XL) 25 MG 24 hr tablet Take 12.5 mg by mouth daily.   09/02/2022   Omega-3 Fatty Acids (FISH OIL PO) Take 1 tablet by mouth as needed (PT DOES NOT TAKE CONSISTENTLY).    Past Week   omeprazole (PRILOSEC) 20 MG capsule Take 20 mg by mouth as needed.    09/02/2022   senna-docusate (SENOKOT-S) 8.6-50 MG tablet Take 2-3 tablets by mouth at bedtime.    Past Week   Simethicone (GAS-X PO) Take 1 tablet by mouth as needed.   Past Week   SUCRALFATE PO Take 1 tablet by mouth as needed.    Past Week    HYDROcodone-acetaminophen (NORCO) 5-325 MG tablet Take 1-2 tablets by mouth every 4 (four) hours as needed for moderate pain. (Patient not taking: Reported on 02/23/2021) 30 tablet 0    metoprolol tartrate (LOPRESSOR) 100 MG tablet Take 1 tablet (100 mg total) by mouth once for 1 dose. Please take one time dose '100mg'$  metoprolol tartrate 2 hr prior to cardiac CT for HR control IF HR >55bpm. 1 tablet 0    predniSONE (DELTASONE) 50 MG tablet Take 1 tablet ('50mg'$ ) 13 hr prior to CT scan, take 1 tablet ('50mg'$ ) 7 hr prior to CT scan, take 1 tablet ('50mg'$ ) 1 hr prior to CT scab 3 tablet 0      Allergies  Allergen Reactions   Contrast Media [Iodinated Contrast Media] Shortness Of Breath   Morphine Hives   Codeine Rash    Hyper    Penicillins Rash     Past Medical History:  Diagnosis Date   Adenomatous colon polyp    Anxiety    Arthritis    hands bil   Depression    Fracture    right arm   GERD (gastroesophageal reflux disease)    Hyperlipemia     Review of systems:  Otherwise negative.    Physical Exam  Gen: Alert,  oriented. Appears stated age.  HEENT: PERRLA. Lungs: No respiratory distress CV: RRR Abd: soft, benign, no masses Ext: No edema    Planned procedures: Proceed with EGD. The patient understands the nature of the planned procedure, indications, risks, alternatives and potential complications including but not limited to bleeding, infection, perforation, damage to internal organs and possible oversedation/side effects from anesthesia. The patient agrees and gives consent to proceed.  Please refer to procedure notes for findings, recommendations and patient disposition/instructions.     [name], MD [facility]

## 2022-09-03 NOTE — Anesthesia Preprocedure Evaluation (Signed)
Anesthesia Evaluation  Patient identified by MRN, date of birth, ID band Patient awake    Reviewed: Allergy & Precautions, NPO status , Patient's Chart, lab work & pertinent test results  Airway Mallampati: II  TM Distance: >3 FB Neck ROM: Full    Dental  (+) Teeth Intact, Caps,    Pulmonary neg pulmonary ROS   Pulmonary exam normal        Cardiovascular hypertension, Pt. on medications + Past MI  negative cardio ROS Normal cardiovascular exam Rhythm:Regular Rate:Normal     Neuro/Psych   Anxiety Depression    negative neurological ROS  negative psych ROS   GI/Hepatic negative GI ROS, Neg liver ROS,GERD  Medicated,,  Endo/Other  negative endocrine ROS    Renal/GU negative Renal ROS  negative genitourinary   Musculoskeletal  (+) Arthritis ,    Abdominal  (+) + obese  Peds negative pediatric ROS (+)  Hematology negative hematology ROS (+)   Anesthesia Other Findings Past Medical History: No date: Adenomatous colon polyp No date: Anxiety No date: Arthritis     Comment:  hands bil No date: Depression No date: Fracture     Comment:  right arm No date: GERD (gastroesophageal reflux disease) No date: Hyperlipemia  Past Surgical History: 1989: ABDOMINAL HYSTERECTOMY 03/10/2017: BREAST BIOPSY; Right     Comment:  PREDOMINANTLY MATURE ADIPOSE TISSUE WITH RARE BENIGN               MAMMARY  04/06/2017: BREAST BIOPSY; Right     Comment:  Procedure: BREAST BIOPSY WITH NEEDLE LOCALIZATION;                Surgeon: Robert Bellow, MD;  Location: [facility] ORS;                Service: General;  Laterality: Right; 04/06/2017: BREAST EXCISIONAL BIOPSY; Right     Comment:  Pt elected to have area excised. PREDOMINANTLY MATURE               ADIPOSE TISSUE WITH RARE BENIGN MAMMARY  No date: COLONOSCOPY 02/23/2021: COLONOSCOPY WITH PROPOFOL; N/A     Comment:  Procedure: COLONOSCOPY WITH PROPOFOL;  Surgeon:                [name], MD;  Location: [facility] ENDOSCOPY;                Service: Endoscopy;  Laterality: N/A; 1990: NASAL SEPTUM SURGERY No date: ORIF RADIAL FRACTURE; Right No date: SHOULDER ARTHROSCOPY W/ ROTATOR CUFF REPAIR No date: TONSILLECTOMY No date: TONSILLECTOMY  BMI    Body Mass Index: 37.80 kg/m      Reproductive/Obstetrics negative OB ROS                             Anesthesia Physical Anesthesia Plan  ASA: 2  Anesthesia Plan: General   Post-op Pain Management:    Induction: Intravenous  PONV Risk Score and Plan: Propofol infusion and TIVA  Airway Management Planned: Natural Airway  Additional Equipment:   Intra-op Plan:   Post-operative Plan:   Informed Consent: I have reviewed the patients History and Physical, chart, labs and discussed the procedure including the risks, benefits and alternatives for the proposed anesthesia with the patient or authorized representative who has indicated his/her understanding and acceptance.     Dental Advisory Given  Plan Discussed with: CRNA and Surgeon  Anesthesia Plan Comments:  Anesthesia Quick Evaluation  

## 2022-09-06 ENCOUNTER — Encounter: Payer: Self-pay | Admitting: Gastroenterology

## 2022-09-07 LAB — SURGICAL PATHOLOGY

## 2022-09-16 NOTE — Anesthesia Postprocedure Evaluation (Signed)
Anesthesia Post Note  Patient: Jillian Armstrong  Procedure(s) Performed: ESOPHAGOGASTRODUODENOSCOPY (EGD) WITH PROPOFOL  Patient location during evaluation: PACU Anesthesia Type: General Level of consciousness: awake Pain management: pain level controlled Vital Signs Assessment: post-procedure vital signs reviewed and stable Respiratory status: spontaneous breathing Cardiovascular status: stable Anesthetic complications: no  No notable events documented.   Last Vitals:  Vitals:   09/03/22 1117 09/03/22 1127  BP: 108/72 117/71  Pulse: 74 64  Resp: 16 16  Temp: (!) 35.8 C   SpO2: 99% 98%    Last Pain:  Vitals:   09/04/22 0915  TempSrc:   PainSc: 0-No pain                 VAN STAVEREN,Tyray Proch

## 2022-10-05 ENCOUNTER — Ambulatory Visit
Admission: RE | Admit: 2022-10-05 | Discharge: 2022-10-05 | Disposition: A | Discharge status: Home / Self Care | Payer: Medicare HMO | Source: Ambulatory Visit | Attending: Internal Medicine | Admitting: Internal Medicine

## 2022-10-05 DIAGNOSIS — Z1231 Encounter for screening mammogram for malignant neoplasm of breast: Secondary | ICD-10-CM | POA: Insufficient documentation

## 2024-07-11 ENCOUNTER — Encounter: Payer: Self-pay | Admitting: *Deleted

## 2024-08-03 ENCOUNTER — Ambulatory Visit
Admission: RE | Admit: 2024-08-03 | Discharge: 2024-08-03 | Disposition: A | Discharge status: Home / Self Care | Attending: Gastroenterology | Admitting: Gastroenterology

## 2024-08-03 ENCOUNTER — Other Ambulatory Visit: Payer: Self-pay

## 2024-08-03 ENCOUNTER — Ambulatory Visit: Admitting: Anesthesiology

## 2024-08-03 ENCOUNTER — Encounter
Admission: RE | Disposition: A | Discharge status: Home / Self Care | Payer: Self-pay | Source: Home / Self Care | Attending: Gastroenterology

## 2024-08-03 DIAGNOSIS — K449 Diaphragmatic hernia without obstruction or gangrene: Secondary | ICD-10-CM | POA: Diagnosis not present

## 2024-08-03 DIAGNOSIS — E669 Obesity, unspecified: Secondary | ICD-10-CM | POA: Insufficient documentation

## 2024-08-03 DIAGNOSIS — G4733 Obstructive sleep apnea (adult) (pediatric): Secondary | ICD-10-CM | POA: Diagnosis not present

## 2024-08-03 DIAGNOSIS — R1310 Dysphagia, unspecified: Secondary | ICD-10-CM | POA: Diagnosis not present

## 2024-08-03 DIAGNOSIS — D120 Benign neoplasm of cecum: Secondary | ICD-10-CM | POA: Diagnosis not present

## 2024-08-03 DIAGNOSIS — F419 Anxiety disorder, unspecified: Secondary | ICD-10-CM | POA: Diagnosis not present

## 2024-08-03 DIAGNOSIS — K2100 Gastro-esophageal reflux disease with esophagitis, without bleeding: Secondary | ICD-10-CM | POA: Diagnosis not present

## 2024-08-03 DIAGNOSIS — Z1211 Encounter for screening for malignant neoplasm of colon: Secondary | ICD-10-CM | POA: Insufficient documentation

## 2024-08-03 DIAGNOSIS — K2970 Gastritis, unspecified, without bleeding: Secondary | ICD-10-CM | POA: Insufficient documentation

## 2024-08-03 HISTORY — PX: POLYPECTOMY: SHX149

## 2024-08-03 HISTORY — PX: ESOPHAGOGASTRODUODENOSCOPY: SHX5428

## 2024-08-03 HISTORY — DX: Gastro-esophageal reflux disease with esophagitis, without bleeding: K21.00

## 2024-08-03 HISTORY — DX: Other specified conduction disorders: I45.89

## 2024-08-03 HISTORY — DX: Cardiac arrhythmia, unspecified: I49.9

## 2024-08-03 HISTORY — DX: Mixed hyperlipidemia: E78.2

## 2024-08-03 HISTORY — DX: Personal history of adenomatous and serrated colon polyps: Z86.0101

## 2024-08-03 HISTORY — PX: COLONOSCOPY: SHX5424

## 2024-08-03 HISTORY — DX: Major depressive disorder, single episode, unspecified: F32.9

## 2024-08-03 HISTORY — DX: Sleep apnea, unspecified: G47.30

## 2024-08-03 SURGERY — COLONOSCOPY
Anesthesia: General

## 2024-08-03 MED ORDER — PROPOFOL 10 MG/ML IV BOLUS
INTRAVENOUS | Status: DC | PRN
  Administered 2024-08-03: 40 mg via INTRAVENOUS
  Administered 2024-08-03 (×2): 30 mg via INTRAVENOUS

## 2024-08-03 MED ORDER — DEXMEDETOMIDINE HCL IN NACL 80 MCG/20ML IV SOLN
INTRAVENOUS | Status: DC | PRN
  Administered 2024-08-03: 8 ug via INTRAVENOUS
  Administered 2024-08-03: 12 ug via INTRAVENOUS

## 2024-08-03 MED ORDER — PROPOFOL 500 MG/50ML IV EMUL
INTRAVENOUS | Status: DC | PRN
Start: 2024-08-03 — End: 2024-08-03
  Administered 2024-08-03: 75 ug/kg/min via INTRAVENOUS

## 2024-08-03 MED ORDER — LIDOCAINE HCL (CARDIAC) PF 100 MG/5ML IV SOSY
PREFILLED_SYRINGE | INTRAVENOUS | Status: DC | PRN
  Administered 2024-08-03: 60 mg via INTRAVENOUS

## 2024-08-03 MED ORDER — LIDOCAINE HCL (PF) 2 % IJ SOLN
INTRAMUSCULAR | Status: AC
  Filled 2024-08-03: qty 20

## 2024-08-03 MED ORDER — GLYCOPYRROLATE 0.2 MG/ML IJ SOLN
INTRAMUSCULAR | Status: DC | PRN
  Administered 2024-08-03: .2 mg via INTRAVENOUS

## 2024-08-03 MED ORDER — SODIUM CHLORIDE 0.9 % IV SOLN: INTRAVENOUS | Status: DC

## 2024-08-03 NOTE — Interval H&P Note (Signed)
 History and Physical Interval Note:  08/03/2024 8:58 AM  Jillian Armstrong  has presented today for surgery, with the diagnosis of Personal history of adenomatous and serrated colon polyps (Z86.0101) Esophageal dysphagia (R13.19).  The various methods of treatment have been discussed with the patient and family. After consideration of risks, benefits and other options for treatment, the patient has consented to  Procedure(s): COLONOSCOPY (N/A) EGD (ESOPHAGOGASTRODUODENOSCOPY) (N/A) as a surgical intervention.  The patient's history has been reviewed, patient examined, no change in status, stable for surgery.  I have reviewed the patient's chart and labs.  Questions were answered to the patient's satisfaction.     [name]  Ok to proceed with EGD/Colonoscopy

## 2024-08-03 NOTE — Transfer of Care (Signed)
 Immediate Anesthesia Transfer of Care Note  Patient: Jillian Armstrong  Procedure(s) Performed: COLONOSCOPY EGD (ESOPHAGOGASTRODUODENOSCOPY) POLYPECTOMY, INTESTINE  Patient Location: PACU  Anesthesia Type:General  Level of Consciousness: sedated  Airway & Oxygen Therapy: Patient Spontanous Breathing  Post-op Assessment: Report given to RN and Post -op Vital signs reviewed and stable  Post vital signs: Reviewed and stable  Last Vitals:  Vitals Value Taken Time  BP 92/58 08/03/24 09:28  Temp    Pulse 77 08/03/24 09:29  Resp 16 08/03/24 09:29  SpO2 96 % 08/03/24 09:29  Vitals shown include unfiled device data.  Last Pain:  Vitals:   08/03/24 0826  TempSrc: Temporal  PainSc: 3          Complications: No notable events documented.

## 2024-08-03 NOTE — Op Note (Signed)
 [facility] Patient Name: Jillian Armstrong Procedure Date: 08/03/2024 8:57 AM MRN: [id number] Account #: [id number] Date of Birth: [birth year] Admit Type: Outpatient Age: 73 Room: [facility] ENDO ROOM 3 Gender: Female Note Status: Finalized Instrument Name: Colon Scope [id number] Procedure:             Colonoscopy Indications:           Surveillance: Personal history of adenomatous polyps                         on last colonoscopy 3 years ago Providers:             Ole Schick MD, MD Referring MD:          Oneil PHEBE Pinal, MD (Referring MD) Medicines:             Monitored Anesthesia Care Complications:         No immediate complications. Estimated blood loss:                         Minimal. Procedure:             Pre-Anesthesia Assessment:                        - Prior to the procedure, a History and Physical was                         performed, and patient medications and allergies were                         reviewed. The patient is competent. The risks and                         benefits of the procedure and the sedation options and                         risks were discussed with the patient. All questions                         were answered and informed consent was obtained.                         Patient identification and proposed procedure were                         verified by the physician, the nurse, the                         anesthesiologist, the anesthetist and the technician                         in the endoscopy suite. Mental Status Examination:                         alert and oriented. Airway Examination: normal                         oropharyngeal airway and neck mobility. Respiratory  Examination: clear to auscultation. CV Examination:                         normal. Prophylactic Antibiotics: The patient does not                         require prophylactic antibiotics. Prior                          Anticoagulants: The patient has taken no anticoagulant                         or antiplatelet agents. ASA Grade Assessment: II - A                         patient with mild systemic disease. After reviewing                         the risks and benefits, the patient was deemed in                         satisfactory condition to undergo the procedure. The                         anesthesia plan was to use monitored anesthesia care                         (MAC). Immediately prior to administration of                         medications, the patient was re-assessed for adequacy                         to receive sedatives. The heart rate, respiratory                         rate, oxygen saturations, blood pressure, adequacy of                         pulmonary ventilation, and response to care were                         monitored throughout the procedure. The physical                         status of the patient was re-assessed after the                         procedure.                        After obtaining informed consent, the colonoscope was                         passed under direct vision. Throughout the procedure,                         the patient's blood pressure, pulse, and oxygen  saturations were monitored continuously. The                         Colonoscope was introduced through the anus and                         advanced to the the terminal ileum, with                         identification of the appendiceal orifice and IC                         valve. The colonoscopy was performed without                         difficulty. The patient tolerated the procedure well.                         The quality of the bowel preparation was good. The                         terminal ileum, ileocecal valve, appendiceal orifice,                         and rectum were photographed. Findings:      The perianal and digital rectal examinations were  normal.      The terminal ileum appeared normal.      Two sessile polyps were found in the cecum. The polyps were 1 to 2 mm in       size. These polyps were removed with a jumbo cold forceps. Resection and       retrieval were complete. Estimated blood loss was minimal.      The exam was otherwise without abnormality. Impression:            - The examined portion of the ileum was normal.                        - Two 1 to 2 mm polyps in the cecum, removed with a                         jumbo cold forceps. Resected and retrieved.                        - The examination was otherwise normal. Recommendation:        - Discharge patient to home.                        - Resume previous diet.                        - Continue present medications.                        - Await pathology results.                        - Repeat colonoscopy in 5 years for surveillance.                        -  Return to referring physician as previously                         scheduled. Procedure Code(s):     --- Professional ---                        [phone], Colonoscopy, flexible; with biopsy, single or                         multiple Diagnosis Code(s):     --- Professional ---                        Z86.010, Personal history of colonic polyps                        D12.0, Benign neoplasm of cecum CPT copyright 2022 American Medical Association. All rights reserved. The codes documented in this report are preliminary and upon coder review may  be revised to meet current compliance requirements. Ole Schick MD, MD 08/03/2024 9:34:09 AM Number of Addenda: 0 Note Initiated On: 08/03/2024 8:57 AM Scope Withdrawal Time: 0 hours 9 minutes 11 seconds  Total Procedure Duration: 0 hours 12 minutes 39 seconds  Estimated Blood Loss:  Estimated blood loss was minimal.      [facility]

## 2024-08-03 NOTE — Op Note (Signed)
 [facility] Patient Name: Jillian Armstrong Procedure Date: 08/03/2024 8:59 AM MRN: [id number] Account #: [id number] Date of Birth: 08-Feb-1951 Admit Type: Outpatient Age: 73 Room: [facility] ENDO ROOM 3 Gender: Female Note Status: Finalized Instrument Name: Upper GI Scope [id number] Procedure:             Upper GI endoscopy Indications:           Dysphagia Providers:             Ole Schick MD, MD Referring MD:          Ronal CANDIE Pinal (Referring MD) Medicines:             Monitored Anesthesia Care Complications:         No immediate complications. Estimated blood loss:                         Minimal. Procedure:             Pre-Anesthesia Assessment:                        - Prior to the procedure, a History and Physical was                         performed, and patient medications and allergies were                         reviewed. The patient is competent. The risks and                         benefits of the procedure and the sedation options and                         risks were discussed with the patient. All questions                         were answered and informed consent was obtained.                         Patient identification and proposed procedure were                         verified by the physician, the nurse, the                         anesthesiologist, the anesthetist and the technician                         in the endoscopy suite. Mental Status Examination:                         alert and oriented. Airway Examination: normal                         oropharyngeal airway and neck mobility. Respiratory                         Examination: clear to auscultation. CV Examination:  normal. Prophylactic Antibiotics: The patient does not                         require prophylactic antibiotics. Prior                         Anticoagulants: The patient has taken no anticoagulant                         or  antiplatelet agents. ASA Grade Assessment: II - A                         patient with mild systemic disease. After reviewing                         the risks and benefits, the patient was deemed in                         satisfactory condition to undergo the procedure. The                         anesthesia plan was to use monitored anesthesia care                         (MAC). Immediately prior to administration of                         medications, the patient was re-assessed for adequacy                         to receive sedatives. The heart rate, respiratory                         rate, oxygen saturations, blood pressure, adequacy of                         pulmonary ventilation, and response to care were                         monitored throughout the procedure. The physical                         status of the patient was re-assessed after the                         procedure.                        After obtaining informed consent, the endoscope was                         passed under direct vision. Throughout the procedure,                         the patient's blood pressure, pulse, and oxygen                         saturations were monitored continuously. The Endoscope  was introduced through the mouth, and advanced to the                         second part of duodenum. The upper GI endoscopy was                         accomplished without difficulty. The patient tolerated                         the procedure well. Findings:      LA Grade A (one or more mucosal breaks less than 5 mm, not extending       between tops of 2 mucosal folds) esophagitis with no bleeding was found.      A small hiatal hernia was present.      Patchy mild inflammation characterized by erythema was found in the       gastric antrum. Biopsies were taken with a cold forceps for Helicobacter       pylori testing. Estimated blood loss was minimal.      The exam of the  stomach was otherwise normal.      The examined duodenum was normal. Impression:            - LA Grade A reflux esophagitis with no bleeding.                        - Small hiatal hernia.                        - Gastritis. Biopsied.                        - Normal examined duodenum. Recommendation:        - Discharge patient to home.                        - Resume previous diet.                        - Continue present medications.                        - Await pathology results.                        - Return to referring physician as previously                         scheduled. Procedure Code(s):     --- Professional ---                        [phone], Esophagogastroduodenoscopy, flexible,                         transoral; with biopsy, single or multiple Diagnosis Code(s):     --- Professional ---                        K21.00, Gastro-esophageal reflux disease with                         esophagitis, without bleeding  K44.9, Diaphragmatic hernia without obstruction or                         gangrene                        K29.70, Gastritis, unspecified, without bleeding                        R13.10, Dysphagia, unspecified CPT copyright 2022 American Medical Association. All rights reserved. The codes documented in this report are preliminary and upon coder review may  be revised to meet current compliance requirements. Ole Schick MD, MD 08/03/2024 9:31:08 AM Number of Addenda: 0 Note Initiated On: 08/03/2024 8:59 AM Estimated Blood Loss:  Estimated blood loss was minimal.      [facility]

## 2024-08-03 NOTE — Anesthesia Preprocedure Evaluation (Signed)
 Anesthesia Evaluation  Patient identified by MRN, date of birth, ID band Patient awake    Reviewed: Allergy & Precautions, NPO status , Patient's Chart, lab work & pertinent test results  Airway Mallampati: II  TM Distance: >3 FB Neck ROM: Full    Dental  (+) Teeth Intact, Caps, Dental Advisory Given   Pulmonary neg pulmonary ROS   Pulmonary exam normal breath sounds clear to auscultation       Cardiovascular Exercise Tolerance: Good negative cardio ROS Normal cardiovascular exam(-) dysrhythmias  Rhythm:Regular Rate:Normal     Neuro/Psych   Anxiety     negative neurological ROS  negative psych ROS   GI/Hepatic negative GI ROS, Neg liver ROS,GERD  Medicated,,  Endo/Other  negative endocrine ROS  Class 3 obesity  Renal/GU negative Renal ROS  negative genitourinary   Musculoskeletal   Abdominal  (+) + obese  Peds negative pediatric ROS (+)  Hematology negative hematology ROS (+)   Anesthesia Other Findings Past Medical History: No date: Adenomatous colon polyp No date: Anxiety No date: Arthritis     Comment:  hands bil 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 No date: Depression No date: Dysrhythmia No date: Esophagitis, reflux No date: Fracture     Comment:  right arm No date: GERD (gastroesophageal reflux disease) No date: Hx of adenomatous colonic polyps No date: Hyperlipemia No date: Hyperlipidemia, mixed No date: Major depressive disorder No date: Sleep apnea  Past Surgical History: 1989: ABDOMINAL HYSTERECTOMY No date: ADENOIDECTOMY; N/A No date: BASAL CELL CARCINOMA EXCISION; Left 03/10/2017: BREAST BIOPSY; Right     Comment:  PREDOMINANTLY MATURE ADIPOSE TISSUE WITH RARE BENIGN               MAMMARY  04/06/2017: BREAST BIOPSY; Right     Comment:  Procedure: BREAST BIOPSY WITH NEEDLE LOCALIZATION;                Surgeon: Dessa Reyes ORN, MD;  Location: [facility] ORS;                Service: General;   Laterality: Right; 04/06/2017: BREAST EXCISIONAL BIOPSY; Right     Comment:  Pt elected to have area excised. PREDOMINANTLY MATURE               ADIPOSE TISSUE WITH RARE BENIGN MAMMARY  No date: COLONOSCOPY 02/23/2021: COLONOSCOPY WITH PROPOFOL ; N/A     Comment:  Procedure: COLONOSCOPY WITH PROPOFOL ;  Surgeon:               Maryruth Ole DASEN, MD;  Location: [facility] ENDOSCOPY;                Service: Endoscopy;  Laterality: N/A; 09/03/2022: ESOPHAGOGASTRODUODENOSCOPY (EGD) WITH PROPOFOL ; N/A     Comment:  Procedure: ESOPHAGOGASTRODUODENOSCOPY (EGD) WITH               PROPOFOL ;  Surgeon: Maryruth Ole DASEN, MD;  Location:               [facility] ENDOSCOPY;  Service: Endoscopy;  Laterality: N/A; 1990: NASAL SEPTUM SURGERY No date: ORIF RADIAL FRACTURE; Right No date: SHOULDER ARTHROSCOPY W/ ROTATOR CUFF REPAIR No date: TONSILLECTOMY  BMI    Body Mass Index: 37.17 kg/m      Reproductive/Obstetrics negative OB ROS                              Anesthesia Physical Anesthesia Plan  ASA: 2  Anesthesia Plan: General  Post-op Pain Management:    Induction: Intravenous  PONV Risk Score and Plan: Propofol  infusion and TIVA  Airway Management Planned: Natural Airway and Nasal Cannula  Additional Equipment:   Intra-op Plan:   Post-operative Plan:   Informed Consent: I have reviewed the patients History and Physical, chart, labs and discussed the procedure including the risks, benefits and alternatives for the proposed anesthesia with the patient or authorized representative who has indicated his/her understanding and acceptance.     Dental Advisory Given  Plan Discussed with: CRNA  Anesthesia Plan Comments:         Anesthesia Quick Evaluation

## 2024-08-03 NOTE — H&P (Signed)
 Outpatient short stay form Pre-procedure 08/03/2024  Jillian ONEIDA Schick, MD  Primary Physician: Cleotilde Oneil FALCON, MD  Reason for visit:  Dysphagia/Surveillance colonoscopy  History of present illness:    73 y/o lady with history of OSA, anxiety, and obesity here for EGD for solid and liquid dysphagia and colonoscopy for history of polyps. No blood thinners. No family history of GI malignancies. No significant abdominal surgeries.    Current Facility-Administered Medications:    0.9 %  sodium chloride  infusion, , Intravenous, Continuous, Nakhi Choi, Jillian ONEIDA, MD, Last Rate: 20 mL/hr at 08/03/24 0828, New Bag at 08/03/24 0828  Facility-Administered Medications Ordered in Other Encounters:    propofol  (DIPRIVAN ) 10 mg/mL bolus/IV push, , Intravenous, Anesthesia Intra-op, Vannie Smaller, CRNA, 20 mg at 09/03/22 1106  Medications Prior to Admission  Medication Sig Dispense Refill Last Dose/Taking   aspirin EC 81 MG tablet Take 81 mg by mouth daily.   Past Month   famotidine (PEPCID) 20 MG tablet Take 20 mg by mouth 2 (two) times daily.   Past Week   metoprolol  succinate (TOPROL -XL) 25 MG 24 hr tablet Take 12.5 mg by mouth daily.   08/02/2024   senna-docusate (SENOKOT-S) 8.6-50 MG tablet Take 2-3 tablets by mouth at bedtime.    Past Week   Simethicone (GAS-X PO) Take 1 tablet by mouth as needed.   08/02/2024   SUCRALFATE PO Take 1 tablet by mouth as needed.    Taking As Needed   acetaminophen  (TYLENOL ) 325 MG tablet Take 325 mg by mouth at bedtime.       b complex vitamins tablet Take 1 tablet by mouth as needed (PT DOES NOT TAKE CONSISTENTLY).       Calcium Carbonate (CALCIUM 600 PO) Take 1 tablet by mouth as needed (PT DOES NOT TAKE CONSISTENTLY).      Cholecalciferol (VITAMIN D PO) Take 1 tablet by mouth as needed (DOES NOT TAKE CONSISTENTLY).      diphenhydrAMINE  (BENADRYL ) 50 MG tablet Take 1 tablet (50mg ) 1 hr prior to CT appt 1 tablet 0    estradiol (ESTRACE) 1 MG tablet        fexofenadine (ALLEGRA ALLERGY) 60 MG tablet Take 60 mg by mouth 2 (two) times daily.      HYDROcodone -acetaminophen  (NORCO) 5-325 MG tablet Take 1-2 tablets by mouth every 4 (four) hours as needed for moderate pain. (Patient not taking: Reported on 02/23/2021) 30 tablet 0    metoprolol  tartrate (LOPRESSOR ) 100 MG tablet Take 1 tablet (100 mg total) by mouth once for 1 dose. Please take one time dose 100mg  metoprolol  tartrate 2 hr prior to cardiac CT for HR control IF HR >55bpm. 1 tablet 0    Omega-3 Fatty Acids (FISH OIL PO) Take 1 tablet by mouth as needed (PT DOES NOT TAKE CONSISTENTLY).       omeprazole (PRILOSEC) 20 MG capsule Take 20 mg by mouth as needed.       predniSONE  (DELTASONE ) 50 MG tablet Take 1 tablet (50mg ) 13 hr prior to CT scan, take 1 tablet (50mg ) 7 hr prior to CT scan, take 1 tablet (50mg ) 1 hr prior to CT scab 3 tablet 0      Allergies  Allergen Reactions   Contrast Media [Iodinated Contrast Media] Shortness Of Breath   Diltiazem  Hcl Other (See Comments)    HEADACHE   Morphine Hives   Codeine Rash    Hyper    Penicillins Rash     Past Medical History:  Diagnosis Date  Adenomatous colon polyp    Anxiety    Arthritis    hands bil   Chronotropic incompetence    Depression    Dysrhythmia    Esophagitis, reflux    Fracture    right arm   GERD (gastroesophageal reflux disease)    Hx of adenomatous colonic polyps    Hyperlipemia    Hyperlipidemia, mixed    Major depressive disorder    Sleep apnea     Review of systems:  Otherwise negative.    Physical Exam  Gen: Alert, oriented. Appears stated age.  HEENT: PERRLA. Lungs: No respiratory distress CV: RRR Abd: soft, benign, no masses Ext: No edema    Planned procedures: Proceed with EGD/colonoscopy. The patient understands the nature of the planned procedure, indications, risks, alternatives and potential complications including but not limited to bleeding, infection, perforation, damage to internal  organs and possible oversedation/side effects from anesthesia. The patient agrees and gives consent to proceed.  Please refer to procedure notes for findings, recommendations and patient disposition/instructions.     Jillian ONEIDA Schick, MD [facility]

## 2024-08-03 NOTE — Anesthesia Postprocedure Evaluation (Signed)
 Anesthesia Post Note  Patient: Jillian Armstrong  Procedure(s) Performed: COLONOSCOPY EGD (ESOPHAGOGASTRODUODENOSCOPY) POLYPECTOMY, INTESTINE  Patient location during evaluation: PACU Anesthesia Type: General Level of consciousness: awake and awake and alert Pain management: satisfactory to patient Vital Signs Assessment: post-procedure vital signs reviewed and stable Respiratory status: spontaneous breathing Cardiovascular status: stable Anesthetic complications: no   No notable events documented.   Last Vitals:  Vitals:   08/03/24 0938 08/03/24 0948  BP: (!) 96/59 102/63  Pulse: 80 70  Resp: 18 14  Temp: (!) 36.3 C (!) 36.3 C  SpO2: 97% 98%    Last Pain:  Vitals:   08/03/24 0948  TempSrc: Temporal  PainSc: 0-No pain                 VAN STAVEREN,Abagail Limb

## 2024-08-06 LAB — SURGICAL PATHOLOGY
